# Patient Record
Sex: Female | Born: 1955
Health system: Southern US, Community
[De-identification: ages and names within clinical notes are randomized; demographics above are authoritative.]

## PROBLEM LIST (undated history)

## (undated) DIAGNOSIS — F419 Anxiety disorder, unspecified: Secondary | ICD-10-CM

## (undated) HISTORY — PX: SHOULDER ARTHROSCOPY W/ ROTATOR CUFF REPAIR: SHX2400

## (undated) HISTORY — DX: Anxiety disorder, unspecified: F41.9

## (undated) HISTORY — PX: CHOLECYSTECTOMY: SHX55

## (undated) HISTORY — PX: ABDOMINAL HYSTERECTOMY: SHX81

---

## 1998-09-03 ENCOUNTER — Encounter: Payer: Self-pay | Admitting: Emergency Medicine

## 1998-09-03 ENCOUNTER — Emergency Department (HOSPITAL_COMMUNITY): Admission: EM | Admit: 1998-09-03 | Discharge: 1998-09-03 | Payer: Self-pay | Admitting: Emergency Medicine

## 1998-09-13 ENCOUNTER — Emergency Department (HOSPITAL_COMMUNITY): Admission: EM | Admit: 1998-09-13 | Discharge: 1998-09-13 | Payer: Self-pay | Admitting: Emergency Medicine

## 1998-09-29 ENCOUNTER — Emergency Department (HOSPITAL_COMMUNITY): Admission: EM | Admit: 1998-09-29 | Discharge: 1998-09-29 | Payer: Self-pay | Admitting: Emergency Medicine

## 2009-07-09 ENCOUNTER — Emergency Department (HOSPITAL_BASED_OUTPATIENT_CLINIC_OR_DEPARTMENT_OTHER): Admission: EM | Admit: 2009-07-09 | Discharge: 2009-07-09 | Payer: Self-pay | Admitting: Emergency Medicine

## 2009-09-03 ENCOUNTER — Ambulatory Visit: Payer: Self-pay | Admitting: Diagnostic Radiology

## 2009-09-03 ENCOUNTER — Emergency Department (HOSPITAL_BASED_OUTPATIENT_CLINIC_OR_DEPARTMENT_OTHER): Admission: EM | Admit: 2009-09-03 | Discharge: 2009-09-04 | Payer: Self-pay | Admitting: Emergency Medicine

## 2010-05-24 LAB — DIFFERENTIAL
Basophils Absolute: 0.1 10*3/uL (ref 0.0–0.1)
Basophils Relative: 1 % (ref 0–1)
Eosinophils Absolute: 0.1 10*3/uL (ref 0.0–0.7)
Eosinophils Relative: 2 % (ref 0–5)
Lymphocytes Relative: 35 % (ref 12–46)
Lymphs Abs: 2.6 10*3/uL (ref 0.7–4.0)
Monocytes Absolute: 0.5 10*3/uL (ref 0.1–1.0)
Monocytes Relative: 7 % (ref 3–12)
Neutro Abs: 4.2 10*3/uL (ref 1.7–7.7)
Neutrophils Relative %: 55 % (ref 43–77)

## 2010-05-24 LAB — CBC
HCT: 42.8 % (ref 36.0–46.0)
Hemoglobin: 14.5 g/dL (ref 12.0–15.0)
MCH: 30.5 pg (ref 26.0–34.0)
MCHC: 34 g/dL (ref 30.0–36.0)
MCV: 89.8 fL (ref 78.0–100.0)
Platelets: 249 10*3/uL (ref 150–400)
RBC: 4.77 MIL/uL (ref 3.87–5.11)
RDW: 12.9 % (ref 11.5–15.5)
WBC: 7.5 10*3/uL (ref 4.0–10.5)

## 2010-05-24 LAB — BASIC METABOLIC PANEL
BUN: 12 mg/dL (ref 6–23)
CO2: 30 mEq/L (ref 19–32)
Calcium: 9.7 mg/dL (ref 8.4–10.5)
Chloride: 107 mEq/L (ref 96–112)
Creatinine, Ser: 1 mg/dL (ref 0.4–1.2)
GFR calc Af Amer: >60 mL/min (ref >60)
GFR calc non Af Amer: 58 mL/min — ABNORMAL LOW (ref >60)
Glucose, Bld: 123 mg/dL — ABNORMAL HIGH (ref 70–99)
Potassium: 4.5 mEq/L (ref 3.5–5.1)
Sodium: 145 mEq/L (ref 135–145)

## 2010-05-24 LAB — POCT CARDIAC MARKERS
CKMB, poc: <1 ng/mL — ABNORMAL LOW (ref 1.0–8.0)
Myoglobin, poc: 44.4 ng/mL (ref 12–200)
Troponin i, poc: <0.05 ng/mL (ref 0.00–0.09)

## 2010-07-07 ENCOUNTER — Emergency Department (HOSPITAL_BASED_OUTPATIENT_CLINIC_OR_DEPARTMENT_OTHER)
Admission: EM | Admit: 2010-07-07 | Discharge: 2010-07-07 | Disposition: A | Discharge status: Home / Self Care | Payer: Self-pay | Attending: Emergency Medicine | Admitting: Emergency Medicine

## 2010-07-07 DIAGNOSIS — E119 Type 2 diabetes mellitus without complications: Secondary | ICD-10-CM | POA: Insufficient documentation

## 2010-07-07 DIAGNOSIS — M79609 Pain in unspecified limb: Secondary | ICD-10-CM | POA: Insufficient documentation

## 2010-09-14 ENCOUNTER — Emergency Department (HOSPITAL_BASED_OUTPATIENT_CLINIC_OR_DEPARTMENT_OTHER)
Admission: EM | Admit: 2010-09-14 | Discharge: 2010-09-14 | Disposition: A | Discharge status: Home / Self Care | Payer: Self-pay | Attending: Emergency Medicine | Admitting: Emergency Medicine

## 2010-09-14 ENCOUNTER — Emergency Department (INDEPENDENT_AMBULATORY_CARE_PROVIDER_SITE_OTHER): Payer: Self-pay

## 2010-09-14 ENCOUNTER — Encounter: Payer: Self-pay | Admitting: *Deleted

## 2010-09-14 DIAGNOSIS — M79609 Pain in unspecified limb: Secondary | ICD-10-CM | POA: Insufficient documentation

## 2010-09-14 DIAGNOSIS — R209 Unspecified disturbances of skin sensation: Secondary | ICD-10-CM | POA: Insufficient documentation

## 2010-09-14 DIAGNOSIS — M5412 Radiculopathy, cervical region: Secondary | ICD-10-CM | POA: Insufficient documentation

## 2010-09-14 DIAGNOSIS — M542 Cervicalgia: Secondary | ICD-10-CM

## 2010-09-14 DIAGNOSIS — E119 Type 2 diabetes mellitus without complications: Secondary | ICD-10-CM | POA: Insufficient documentation

## 2010-09-14 MED ORDER — HYDROCODONE-ACETAMINOPHEN 5-325 MG PO TABS: 2.0000 | ORAL_TABLET | ORAL | Status: AC | PRN

## 2010-09-14 MED ORDER — PREDNISONE 10 MG PO TABS: 20.0000 mg | ORAL_TABLET | Freq: Two times a day (BID) | ORAL | Status: AC

## 2010-09-14 MED ORDER — PREDNISONE 10 MG PO TABS: 20.0000 mg | ORAL_TABLET | Freq: Two times a day (BID) | ORAL | Status: DC
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
## 2010-09-14 NOTE — ED Notes (Signed)
Pt state that she was seen two months ago for same sx told she had a muscle strain from playing the wii pt reports that she has had neck shoulder and arm pain as well as numbness in 4th and 5th digits of right hand

## 2010-10-11 NOTE — ED Provider Notes (Signed)
History     CSN: [id number] Arrival date & time: 09/14/2010  8:49 PM  Chief Complaint  Patient presents with  . Arm Injury   HPI  Past Medical History  Diagnosis Date  . Diabetes mellitus     Past Surgical History  Procedure Date  . Cholecystectomy   . Abdominal hysterectomy     History reviewed. No pertinent family history.  History  Substance Use Topics  . Smoking status: Never Smoker   . Smokeless tobacco: Not on file  . Alcohol Use: No    OB History    Grav Para Term Preterm Abortions TAB SAB Ect Mult Living                  Review of Systems  Physical Exam  BP 131/88  Pulse 97  Temp(Src) 98.1 F (36.7 C) (Oral)  Resp 20  Ht 5' (1.524 m)  SpO2 96%  Physical Exam  ED Course  Procedures  MDM Agree with above, signed under my name. Tayonna Bacha-Rasch MD     Michai Dieppa Smitty Cords, MD 10/11/10 [phone]

## 2012-08-22 ENCOUNTER — Emergency Department (HOSPITAL_BASED_OUTPATIENT_CLINIC_OR_DEPARTMENT_OTHER): Payer: Self-pay

## 2012-08-22 ENCOUNTER — Encounter (HOSPITAL_BASED_OUTPATIENT_CLINIC_OR_DEPARTMENT_OTHER): Payer: Self-pay

## 2012-08-22 ENCOUNTER — Emergency Department (HOSPITAL_BASED_OUTPATIENT_CLINIC_OR_DEPARTMENT_OTHER)
Admission: EM | Admit: 2012-08-22 | Discharge: 2012-08-22 | Disposition: A | Discharge status: Home / Self Care | Payer: Self-pay | Attending: Emergency Medicine | Admitting: Emergency Medicine

## 2012-08-22 DIAGNOSIS — M779 Enthesopathy, unspecified: Secondary | ICD-10-CM

## 2012-08-22 DIAGNOSIS — E119 Type 2 diabetes mellitus without complications: Secondary | ICD-10-CM | POA: Insufficient documentation

## 2012-08-22 DIAGNOSIS — Z79899 Other long term (current) drug therapy: Secondary | ICD-10-CM | POA: Insufficient documentation

## 2012-08-22 DIAGNOSIS — M65839 Other synovitis and tenosynovitis, unspecified forearm: Secondary | ICD-10-CM | POA: Insufficient documentation

## 2012-08-22 NOTE — ED Provider Notes (Signed)
I saw and evaluated the patient, reviewed the resident's note and I agree with the findings and plan.  No signs of swelling or infection.? Overuse injury.  Will splint.  Dc home.  OTC meds.  Follow up with sports med as needed  Celene Kras, MD 08/22/12 2019

## 2012-08-22 NOTE — ED Provider Notes (Signed)
History     CSN: [id number]  Arrival date & time 08/22/12  1901   None     Chief Complaint  Patient presents with  . Joint Swelling    (Consider location/radiation/quality/duration/timing/severity/associated sxs/prior treatment) HPI Pt had sudden onset swelling of right thumb worsening throughout the day. Pt doesn't remember any trauma to the area or other injury to the hand/thumb. She has been doing many repetitive movement activities including crocheting and video gaming and using a wand to play a game with that hand and thumb. She is not doing any other heavy lifting and hasn't been gardening. Pt doesn't have any known allergies or taken any OTC meds to help relief symptoms. Pt is not really having pain but just some limited ROM. This problem has not happened before but pt has had radicular pain in that arm before and been evaluated by ED where no acute cause was found and pt was lost to f/u with orthopedics.   Past Medical History  Diagnosis Date  . Diabetes mellitus     Past Surgical History  Procedure Laterality Date  . Cholecystectomy    . Abdominal hysterectomy      No family history on file.  History  Substance Use Topics  . Smoking status: Never Smoker   . Smokeless tobacco: Not on file  . Alcohol Use: No    OB History   Grav Para Term Preterm Abortions TAB SAB Ect Mult Living                  Review of Systems  Constitutional: Negative for fever.  Musculoskeletal: Positive for joint swelling. Negative for myalgias and arthralgias.  Skin: Negative for color change, pallor, rash and wound.  Neurological: Negative for tremors and weakness.    Allergies  Review of patient's allergies indicates no known allergies.  Home Medications   Current Outpatient Rx  Name  Route  Sig  Dispense  Refill  . LISINOPRIL PO   Oral   Take by mouth.         Marland Kitchen LOVASTATIN PO   Oral   Take by mouth.         . VENLAFAXINE HCL PO   Oral   Take by mouth.         . diphenhydrAMINE (BENADRYL) 2 % cream   Topical   Apply 1 application topically 4 (four) times daily as needed. For sores on arm          . glipiZIDE (GLUCOTROL) 10 MG tablet   Oral   Take 10 mg by mouth 2 (two) times daily before a meal.           . ibuprofen (ADVIL,MOTRIN) 200 MG tablet   Oral   Take 600 mg by mouth as needed. For pain          . metFORMIN (GLUMETZA) 500 MG (MOD) 24 hr tablet   Oral   Take 500 mg by mouth 2 (two) times daily with a meal.             BP 128/84  Pulse 77  Temp(Src) 98.1 F (36.7 C) (Oral)  Resp 20  Ht 5' (1.524 m)  Wt 200 lb (90.719 kg)  BMI 39.06 kg/m2  SpO2 98%  Physical Exam General: resting in bed, NAD HEENT: PERRL, EOMI, MMM, no scleral icterus Cardiac: RRR, no rubs, murmurs or gallops Pulm: clear to auscultation bilaterally, moving normal volumes of air Abd: soft, nontender, nondistended, BS present Ext: warm and  well perfused, no pedal edema, right thumb slightly swollen, ttp only over , some limitation in thumb opposition 2/2 swelling not pain, thumb flexion/extension/abduction and adduction all normal, FROM of wrist and other finger digits, no effusions noted, no other joints inflammed or sore, no skin rashes Neuro: alert and oriented X3, cranial nerves II-XII grossly intact   ED Course  Procedures (including critical care time)  Labs Reviewed - No data to display No results found.   No diagnosis found.    MDM  730 evaluated pt and no open lesions, concern for cellulitis, gout, or monacular joint infection but will order XRay to r/o acute fracture.   Pt most likely tendonitis and prn NSAIDs, RICE was advised for patient. Pt was also placed in thumb spica.   Pt was discussed with Dr. Lynelle Doctor.         Christen Bame, MD 08/22/12 2017

## 2012-08-22 NOTE — ED Notes (Signed)
C/o swelling to right thumb x 4 days-denies injury

## 2013-12-30 ENCOUNTER — Encounter (HOSPITAL_BASED_OUTPATIENT_CLINIC_OR_DEPARTMENT_OTHER): Payer: Self-pay | Admitting: Emergency Medicine

## 2013-12-30 ENCOUNTER — Emergency Department (HOSPITAL_BASED_OUTPATIENT_CLINIC_OR_DEPARTMENT_OTHER): Payer: Self-pay

## 2013-12-30 ENCOUNTER — Emergency Department (HOSPITAL_BASED_OUTPATIENT_CLINIC_OR_DEPARTMENT_OTHER)
Admission: EM | Admit: 2013-12-30 | Discharge: 2013-12-30 | Disposition: A | Discharge status: Home / Self Care | Payer: Self-pay | Attending: Emergency Medicine | Admitting: Emergency Medicine

## 2013-12-30 DIAGNOSIS — M722 Plantar fascial fibromatosis: Secondary | ICD-10-CM | POA: Insufficient documentation

## 2013-12-30 DIAGNOSIS — M79661 Pain in right lower leg: Secondary | ICD-10-CM | POA: Insufficient documentation

## 2013-12-30 DIAGNOSIS — Z79899 Other long term (current) drug therapy: Secondary | ICD-10-CM | POA: Insufficient documentation

## 2013-12-30 DIAGNOSIS — E119 Type 2 diabetes mellitus without complications: Secondary | ICD-10-CM | POA: Insufficient documentation

## 2013-12-30 MED ORDER — HYDROCODONE-ACETAMINOPHEN 5-325 MG PO TABS: 1.0000 | ORAL_TABLET | ORAL | Status: DC | PRN

## 2013-12-30 MED ORDER — NAPROXEN 500 MG PO TABS
500.0000 mg | ORAL_TABLET | Freq: Two times a day (BID) | ORAL | Status: DC
Start: 2013-12-30 — End: 2018-01-09

## 2013-12-30 NOTE — ED Provider Notes (Signed)
CSN: [id number]     Arrival date & time 12/30/13  1859 History   First MD Initiated Contact with Patient 12/30/13 2041     Chief Complaint  Patient presents with  . Leg Pain     (Consider location/radiation/quality/duration/timing/severity/associated sxs/prior Treatment) HPI Comments: The patient is a 58 year old female presenting to the emergency department with chief complaint of right ankle, calf pain. Patient reports intermittent heel pain for several months worsening over the last several days. She reports associated right calf pain since today and right hip pain. Denies change in gait. She denies right lower extremity edema. She reports recently traveling from the beach, onset of calf discomfort after. No family history or personal history of DVT/PE, lower extremity swelling, smoking, cancer, or exogenous estrogen.  Denies injury to foot.  The history is provided by the patient. No language interpreter was used.    Past Medical History  Diagnosis Date  . Diabetes mellitus    Past Surgical History  Procedure Laterality Date  . Cholecystectomy    . Abdominal hysterectomy     No family history on file. History  Substance Use Topics  . Smoking status: Never Smoker   . Smokeless tobacco: Not on file  . Alcohol Use: No   OB History   Grav Para Term Preterm Abortions TAB SAB Ect Mult Living                 Review of Systems  Respiratory: Negative for cough and shortness of breath.   Cardiovascular: Negative for leg swelling.  Musculoskeletal: Positive for arthralgias, gait problem and myalgias.      Allergies  Review of patient's allergies indicates no known allergies.  Home Medications   Prior to Admission medications   Medication Sig Start Date End Date Taking? Authorizing Provider  diphenhydrAMINE (BENADRYL) 2 % cream Apply 1 application topically 4 (four) times daily as needed. For sores on arm     Historical Provider, MD  glipiZIDE (GLUCOTROL) 10 MG tablet  Take 10 mg by mouth 2 (two) times daily before a meal.      Historical Provider, MD  ibuprofen (ADVIL,MOTRIN) 200 MG tablet Take 600 mg by mouth as needed. For pain     Historical Provider, MD  LISINOPRIL PO Take by mouth.    Historical Provider, MD  LOVASTATIN PO Take by mouth.    Historical Provider, MD  metFORMIN (GLUMETZA) 500 MG (MOD) 24 hr tablet Take 500 mg by mouth 2 (two) times daily with a meal.      Historical Provider, MD  VENLAFAXINE HCL PO Take by mouth.    Historical Provider, MD   BP 105/71  Pulse 80  Temp(Src) 97.9 F (36.6 C) (Oral)  Resp 18  Ht 5\' 2"  (1.575 m)  Wt 188 lb (85.276 kg)  BMI 34.38 kg/m2  SpO2 96% Physical Exam  Nursing note and vitals reviewed. Constitutional: She is oriented to person, place, and time. She appears well-developed and well-nourished. No distress.  HENT:  Head: Normocephalic and atraumatic.  Neck: Neck supple.  Cardiovascular:  Pulses:      Dorsalis pedis pulses are 2+ on the right side, and 2+ on the left side.  Negative Homans.No lower extremity edema. No calf tenderness bilaterally.  Pulmonary/Chest: Effort normal. No respiratory distress.  Musculoskeletal:       Right hip: She exhibits normal range of motion.       Right knee: She exhibits normal range of motion. No tenderness found.  Right foot: She exhibits normal range of motion, no bony tenderness, no swelling and no deformity.  Neurological: She is alert and oriented to person, place, and time.  Skin: Skin is warm and dry. She is not diaphoretic.  Psychiatric: She has a normal mood and affect. Her behavior is normal.    ED Course  Procedures (including critical care time) Labs Review Labs Reviewed - No data to display  Imaging Review [place] Venous Img Lower Unilateral Right  12/30/2013   CLINICAL DATA:  Right posterior heel pain, radiating to the calf and thigh, swelling to the right ankle.  EXAM: Right LOWER EXTREMITY VENOUS DOPPLER ULTRASOUND  TECHNIQUE: Gray-scale  sonography with graded compression, as well as color Doppler and duplex ultrasound were performed to evaluate the lower extremity deep venous systems from the level of the common femoral vein and including the common femoral, femoral, profunda femoral, popliteal and calf veins including the posterior tibial, peroneal and gastrocnemius veins when visible. The superficial great saphenous vein was also interrogated. Spectral Doppler was utilized to evaluate flow at rest and with distal augmentation maneuvers in the common femoral, femoral and popliteal veins.  COMPARISON:  None.  FINDINGS: Common Femoral Vein: No evidence of thrombus. Normal compressibility, respiratory phasicity and response to augmentation.  Saphenofemoral Junction: No evidence of thrombus. Normal compressibility and flow on color Doppler imaging.  Profunda Femoral Vein: No evidence of thrombus. Normal compressibility and flow on color Doppler imaging.  Femoral Vein: Duplicated. No evidence of thrombus. Normal compressibility, respiratory phasicity and response to augmentation.  Popliteal Vein: No evidence of thrombus. Normal compressibility, respiratory phasicity and response to augmentation.  Calf Veins: No evidence of thrombus. Normal compressibility and flow on color Doppler imaging.  Superficial Great Saphenous Vein: No evidence of thrombus. Normal compressibility and flow on color Doppler imaging.  Venous Reflux:  None.  Other Findings:  None.  IMPRESSION: No evidence of deep venous thrombosis within the visualized veins of the right lower extremity.   Electronically Signed   By: Jearld LeschAndrew  DelGaizo M.D.   On: 12/30/2013 21:24     EKG Interpretation None      MDM   Final diagnoses:  Calf pain, right  Plantar fascia syndrome   Patient presents with right ankle pain, no injury, no obvious abnormalities. Patient also complains of right calf pain, onset after car ride no obvious sign of DVT plan to ultrasound to rule out DVT. Ultrasound  without DVT. Plan to treat with anti-inflammatories, encouraged supporting shoes, ice, follow-up with orthopedic as needed. Meds given in ED:  Medications - No data to display  Discharge Medication List as of 12/30/2013 10:01 PM    START taking these medications   Details  HYDROcodone-acetaminophen (NORCO/VICODIN) 5-325 MG per tablet Take 1 tablet by mouth every 4 (four) hours as needed., Starting 12/30/2013, Until Discontinued, Print    naproxen (NAPROSYN) 500 MG tablet Take 1 tablet (500 mg total) by mouth 2 (two) times daily., Starting 12/30/2013, Until Discontinued, Print        Mellody DrownLauren Leigh Kaeding, PA-C 12/30/13 [phone]

## 2013-12-30 NOTE — ED Provider Notes (Signed)
Medical screening examination/treatment/procedure(s) were performed by non-physician practitioner and as supervising physician I was immediately available for consultation/collaboration.     Geoffery Lyonsouglas Justise Ehmann, MD 12/30/13 [phone]

## 2013-12-30 NOTE — ED Notes (Signed)
Pt reports right ankle pain and heel pain that started this weekend but now has calf and thigh pain that radiates to buttock.  Denies injury.  Reports rode to the beach this weekend.  Mild swelling to right ankle.

## 2013-12-30 NOTE — Discharge Instructions (Signed)
Call for a follow up appointment with a Family or Primary Care Provider.  Return if Symptoms worsen.   Take medication as prescribed.  Ice your heel 3-4 times a day. Wear supportive shoes.

## 2016-06-30 ENCOUNTER — Emergency Department (HOSPITAL_COMMUNITY)
Admission: EM | Admit: 2016-06-30 | Discharge: 2016-07-01 | Disposition: A | Discharge status: Home / Self Care | Payer: BLUE CROSS/BLUE SHIELD | Attending: Emergency Medicine | Admitting: Emergency Medicine

## 2016-06-30 ENCOUNTER — Encounter (HOSPITAL_COMMUNITY): Payer: Self-pay | Admitting: Vascular Surgery

## 2016-06-30 DIAGNOSIS — F17210 Nicotine dependence, cigarettes, uncomplicated: Secondary | ICD-10-CM | POA: Insufficient documentation

## 2016-06-30 DIAGNOSIS — Z7984 Long term (current) use of oral hypoglycemic drugs: Secondary | ICD-10-CM | POA: Diagnosis not present

## 2016-06-30 DIAGNOSIS — E860 Dehydration: Secondary | ICD-10-CM | POA: Insufficient documentation

## 2016-06-30 DIAGNOSIS — E1165 Type 2 diabetes mellitus with hyperglycemia: Secondary | ICD-10-CM | POA: Diagnosis not present

## 2016-06-30 DIAGNOSIS — R739 Hyperglycemia, unspecified: Secondary | ICD-10-CM

## 2016-06-30 LAB — I-STAT VENOUS BLOOD GAS, ED
ACID-BASE DEFICIT: 4 mmol/L — AB (ref 0.0–2.0)
Bicarbonate: 21.7 mmol/L (ref 20.0–28.0)
O2 SAT: 27 %
PH VEN: 7.329 (ref 7.250–7.430)
Patient temperature: 98.6
TCO2: 23 mmol/L (ref 0–100)
pCO2, Ven: 41.2 mmHg — ABNORMAL LOW (ref 44.0–60.0)
pO2, Ven: 19 mmHg — CL (ref 32.0–45.0)

## 2016-06-30 LAB — CBC WITH DIFFERENTIAL/PLATELET
BASOS ABS: 0 10*3/uL (ref 0.0–0.1)
Basophils Relative: 0 %
EOS ABS: 0 10*3/uL (ref 0.0–0.7)
EOS PCT: 0 %
HCT: 43 % (ref 36.0–46.0)
Hemoglobin: 15.6 g/dL — ABNORMAL HIGH (ref 12.0–15.0)
LYMPHS PCT: 9 %
Lymphs Abs: 0.6 10*3/uL — ABNORMAL LOW (ref 0.7–4.0)
MCH: 30.6 pg (ref 26.0–34.0)
MCHC: 36.3 g/dL — ABNORMAL HIGH (ref 30.0–36.0)
MCV: 84.5 fL (ref 78.0–100.0)
MONO ABS: 0.4 10*3/uL (ref 0.1–1.0)
Monocytes Relative: 5 %
Neutro Abs: 6.2 10*3/uL (ref 1.7–7.7)
Neutrophils Relative %: 86 %
PLATELETS: 212 10*3/uL (ref 150–400)
RBC: 5.09 MIL/uL (ref 3.87–5.11)
RDW: 12.1 % (ref 11.5–15.5)
WBC: 7.2 10*3/uL (ref 4.0–10.5)

## 2016-06-30 LAB — URINALYSIS, ROUTINE W REFLEX MICROSCOPIC
BACTERIA UA: NONE SEEN
BILIRUBIN URINE: NEGATIVE
Glucose, UA: >=500 mg/dL — AB
Ketones, ur: NEGATIVE mg/dL
LEUKOCYTES UA: NEGATIVE
NITRITE: NEGATIVE
Protein, ur: NEGATIVE mg/dL
SPECIFIC GRAVITY, URINE: 1.03 (ref 1.005–1.030)
WBC UA: NONE SEEN WBC/hpf (ref 0–5)
pH: 5 (ref 5.0–8.0)

## 2016-06-30 LAB — CBG MONITORING, ED: Glucose-Capillary: 427 mg/dL — ABNORMAL HIGH (ref 65–99)

## 2016-06-30 LAB — BASIC METABOLIC PANEL
Anion gap: 13 (ref 5–15)
BUN: 16 mg/dL (ref 6–20)
CHLORIDE: 94 mmol/L — AB (ref 101–111)
CO2: 19 mmol/L — AB (ref 22–32)
CREATININE: 1.2 mg/dL — AB (ref 0.44–1.00)
Calcium: 9.4 mg/dL (ref 8.9–10.3)
GFR calc non Af Amer: 49 mL/min — ABNORMAL LOW (ref >60)
GFR, EST AFRICAN AMERICAN: 57 mL/min — AB (ref >60)
Glucose, Bld: 726 mg/dL (ref 65–99)
Potassium: 4.2 mmol/L (ref 3.5–5.1)
SODIUM: 126 mmol/L — AB (ref 135–145)

## 2016-06-30 MED ORDER — SODIUM CHLORIDE 0.9 % IV BOLUS (SEPSIS)
1000.0000 mL | Freq: Once | INTRAVENOUS | Status: AC
  Administered 2016-06-30: 1000 mL via INTRAVENOUS

## 2016-06-30 MED ORDER — METFORMIN HCL 1000 MG PO TABS: 1000.0000 mg | ORAL_TABLET | Freq: Two times a day (BID) | ORAL | 0 refills | Status: DC

## 2016-06-30 MED ORDER — INSULIN ASPART 100 UNIT/ML ~~LOC~~ SOLN
10.0000 [IU] | Freq: Once | SUBCUTANEOUS | Status: AC
  Administered 2016-06-30: 10 [IU] via INTRAVENOUS
  Filled 2016-06-30: qty 1

## 2016-06-30 MED ORDER — GLIPIZIDE 10 MG PO TABS
10.0000 mg | ORAL_TABLET | Freq: Two times a day (BID) | ORAL | 0 refills | Status: DC
Start: 2016-06-30 — End: 2018-01-09

## 2016-06-30 MED ORDER — SODIUM CHLORIDE 0.9 % IV SOLN
INTRAVENOUS | Status: DC
  Administered 2016-06-30: 3.7 [IU]/h via INTRAVENOUS
  Filled 2016-06-30: qty 2.5

## 2016-06-30 MED ORDER — LACTATED RINGERS IV BOLUS (SEPSIS)
1000.0000 mL | Freq: Once | INTRAVENOUS | Status: AC
  Administered 2016-06-30: 1000 mL via INTRAVENOUS

## 2016-06-30 MED ORDER — DEXTROSE-NACL 5-0.45 % IV SOLN: INTRAVENOUS | Status: DC

## 2016-06-30 NOTE — ED Triage Notes (Signed)
Pt reports to the ED for eval of hyperglycemia. She is on Metformin and Trulicity and she states that she has not been on her Metformin over the past week because it makes her nauseated. She also states that she has been drinking regular Pepsis. She states that tonight she noticed her BG was too high to read. She states that she has noticed she has felt hot and cold, having dry mouth, and polyuria.

## 2016-06-30 NOTE — ED Provider Notes (Signed)
[facility]-EMERGENCY DEPT Provider Note   CSN: [id number] Arrival date & time: 06/30/16  2110     History   Chief Complaint Chief Complaint  Patient presents with  . Hyperglycemia    HPI Tricia Ramsey is a 61 y.o. female.  The history is provided by the patient.  Hyperglycemia  Blood sugar level PTA:  Hi Severity:  Moderate Onset quality:  Gradual Duration:  4 months (was last glucose check before today) Timing:  Constant Progression:  Worsening Chronicity:  Chronic Diabetes status:  Controlled with oral medications Current diabetic therapy:  Noncompliant with all Time since last antidiabetic medication:  4 months Context: noncompliance   Relieved by:  Nothing Ineffective treatments:  None tried Associated symptoms: dehydration, increased appetite, increased thirst, malaise, polyuria and weakness   Associated symptoms: no fever     Past Medical History:  Diagnosis Date  . Diabetes mellitus     There are no active problems to display for this patient.   Past Surgical History:  Procedure Laterality Date  . ABDOMINAL HYSTERECTOMY    . CHOLECYSTECTOMY    . SHOULDER ARTHROSCOPY W/ ROTATOR CUFF REPAIR      OB History    No data available       Home Medications    Prior to Admission medications   Medication Sig Start Date End Date Taking? Authorizing Provider  diphenhydrAMINE (BENADRYL) 2 % cream Apply 1 application topically 4 (four) times daily as needed. For sores on arm     Historical Provider, MD  glipiZIDE (GLUCOTROL) 10 MG tablet Take 10 mg by mouth 2 (two) times daily before a meal.      Historical Provider, MD  HYDROcodone-acetaminophen (NORCO/VICODIN) 5-325 MG per tablet Take 1 tablet by mouth every 4 (four) hours as needed. 12/30/13   Mellody Drown, PA-C  LISINOPRIL PO Take by mouth.    Historical Provider, MD  LOVASTATIN PO Take by mouth.    Historical Provider, MD  metFORMIN (GLUMETZA) 500 MG (MOD) 24 hr tablet Take 500 mg by mouth 2 (two) times  daily with a meal.      Historical Provider, MD  naproxen (NAPROSYN) 500 MG tablet Take 1 tablet (500 mg total) by mouth 2 (two) times daily. 12/30/13   Mellody Drown, PA-C  VENLAFAXINE HCL PO Take by mouth.    Historical Provider, MD    Family History No family history on file.  Social History Social History  Substance Use Topics  . Smoking status: Current Every Day Smoker    Packs/day: 1.00    Types: Cigarettes  . Smokeless tobacco: Never Used  . Alcohol use No     Allergies   Patient has no known allergies.   Review of Systems Review of Systems  Constitutional: Negative for fever.  Endocrine: Positive for polydipsia and polyuria.  Neurological: Positive for weakness.  All other systems reviewed and are negative.    Physical Exam Updated Vital Signs BP 124/86 (BP Location: Right Arm)   Pulse (!) 137   Temp 98.6 F (37 C) (Oral)   Resp 18   SpO2 97%   Physical Exam  Constitutional: She is oriented to person, place, and time. She appears well-developed and well-nourished. No distress.  HENT:  Head: Normocephalic.  Nose: Nose normal.  Eyes: Conjunctivae are normal.  Neck: Neck supple. No tracheal deviation present.  Cardiovascular: Regular rhythm and normal heart sounds.  Tachycardia present.   Pulmonary/Chest: Effort normal and breath sounds normal. No respiratory distress.  Abdominal: Soft.  She exhibits no distension.  Neurological: She is alert and oriented to person, place, and time.  Skin: Skin is warm and dry.  Psychiatric: She has a normal mood and affect.     ED Treatments / Results  Labs (all labs ordered are listed, but only abnormal results are displayed) Labs Reviewed  BASIC METABOLIC PANEL - Abnormal; Notable for the following:       Result Value   Sodium 126 (*)    Chloride 94 (*)    CO2 19 (*)    Glucose, Bld 726 (*)    Creatinine, Ser 1.20 (*)    GFR calc non Af Amer 49 (*)    GFR calc Af Amer 57 (*)    All other components within  normal limits  URINALYSIS, ROUTINE W REFLEX MICROSCOPIC - Abnormal; Notable for the following:    Color, Urine STRAW (*)    Glucose, UA >=500 (*)    Hgb urine dipstick SMALL (*)    Squamous Epithelial / LPF 0-5 (*)    All other components within normal limits  CBC WITH DIFFERENTIAL/PLATELET - Abnormal; Notable for the following:    Hemoglobin 15.6 (*)    MCHC 36.3 (*)    Lymphs Abs 0.6 (*)    All other components within normal limits  CBG MONITORING, ED - Abnormal; Notable for the following:    Glucose-Capillary >600 (*)    All other components within normal limits  I-STAT VENOUS BLOOD GAS, ED - Abnormal; Notable for the following:    pCO2, Ven 41.2 (*)    pO2, Ven 19.0 (*)    Acid-base deficit 4.0 (*)    All other components within normal limits  CBG MONITORING, ED - Abnormal; Notable for the following:    Glucose-Capillary >600 (*)    All other components within normal limits  CBG MONITORING, ED - Abnormal; Notable for the following:    Glucose-Capillary 427 (*)    All other components within normal limits  CBG MONITORING, ED - Abnormal; Notable for the following:    Glucose-Capillary 340 (*)    All other components within normal limits  CBG MONITORING, ED - Abnormal; Notable for the following:    Glucose-Capillary 266 (*)    All other components within normal limits  CBG MONITORING, ED - Abnormal; Notable for the following:    Glucose-Capillary 282 (*)    All other components within normal limits    EKG  EKG Interpretation  Date/Time:  Wednesday June 30 2016 21:30:28 EDT Ventricular Rate:  143 PR Interval:  126 QRS Duration: 64 QT Interval:  338 QTC Calculation: 521 R Axis:   52 Text Interpretation:  Sinus tachycardia Possible Left atrial enlargement Nonspecific ST and T wave abnormality probably rate related Abnormal ECG Confirmed by Johnaton Sonneborn MD, Richanda Darin ([id number]) on 06/30/2016 9:40:07 PM       Radiology No results found.  Procedures Procedures (including  critical care time)  Medications Ordered in ED Medications  insulin aspart (novoLOG) injection 10 Units (not administered)  sodium chloride 0.9 % bolus 1,000 mL (not administered)    And  sodium chloride 0.9 % bolus 1,000 mL (not administered)     Initial Impression / Assessment and Plan / ED Course  I have reviewed the triage vital signs and the nursing notes.  Pertinent labs & imaging results that were available during my care of the patient were reviewed by me and considered in my medical decision making (see chart for details).  61 year old female presents with hyperglycemia that is too high to read on her home glucose monitor after noncompliance with her home oral medication regimen. Blood sugar was greater than 700 on arrival, she was given 3 L of IV fluid and an insulin bolus and infusion which was able to bring down below 300 so that she can read at home inappropriately administer her medications. Refills were provided. Recommended follow-up with primary care physician or return to the emergency department with worsening or new concerning symptoms. No evidence of DKA or HNKK today without anion gap and I suspect mild acidosis is secondary to dehydration rather than metabolic derangement.  Final Clinical Impressions(s) / ED Diagnoses   Final diagnoses:  Hyperglycemia without ketosis  Dehydration    New Prescriptions Discharge Medication List as of 06/30/2016 11:40 PM    START taking these medications   Details  metFORMIN (GLUCOPHAGE) 1000 MG tablet Take 1 tablet (1,000 mg total) by mouth 2 (two) times daily., Starting Wed 06/30/2016, Print         Lyndal Pulley, MD 07/01/16 [phone]

## 2016-06-30 NOTE — ED Notes (Signed)
Dr Clydene Pugh given a copy of venous blood gas

## 2016-07-01 LAB — CBG MONITORING, ED
Glucose-Capillary: 266 mg/dL — ABNORMAL HIGH (ref 65–99)
Glucose-Capillary: 282 mg/dL — ABNORMAL HIGH (ref 65–99)
Glucose-Capillary: 340 mg/dL — ABNORMAL HIGH (ref 65–99)

## 2016-07-01 NOTE — ED Notes (Signed)
Per DR Patria Mane, D/C insulin drip and discharge patient

## 2016-07-01 NOTE — ED Notes (Signed)
Verified by Alona Bene, RN for insulin change

## 2017-06-15 ENCOUNTER — Emergency Department (HOSPITAL_COMMUNITY)
Admission: EM | Admit: 2017-06-15 | Discharge: 2017-06-15 | Disposition: A | Discharge status: Home / Self Care | Payer: BLUE CROSS/BLUE SHIELD | Attending: Emergency Medicine | Admitting: Emergency Medicine

## 2017-06-15 ENCOUNTER — Other Ambulatory Visit: Payer: Self-pay

## 2017-06-15 ENCOUNTER — Encounter (HOSPITAL_COMMUNITY): Payer: Self-pay | Admitting: Emergency Medicine

## 2017-06-15 DIAGNOSIS — E860 Dehydration: Secondary | ICD-10-CM

## 2017-06-15 DIAGNOSIS — Z9049 Acquired absence of other specified parts of digestive tract: Secondary | ICD-10-CM | POA: Insufficient documentation

## 2017-06-15 DIAGNOSIS — R739 Hyperglycemia, unspecified: Secondary | ICD-10-CM

## 2017-06-15 DIAGNOSIS — Z79899 Other long term (current) drug therapy: Secondary | ICD-10-CM | POA: Insufficient documentation

## 2017-06-15 DIAGNOSIS — E1165 Type 2 diabetes mellitus with hyperglycemia: Secondary | ICD-10-CM | POA: Insufficient documentation

## 2017-06-15 DIAGNOSIS — F17210 Nicotine dependence, cigarettes, uncomplicated: Secondary | ICD-10-CM | POA: Insufficient documentation

## 2017-06-15 DIAGNOSIS — Z7984 Long term (current) use of oral hypoglycemic drugs: Secondary | ICD-10-CM | POA: Insufficient documentation

## 2017-06-15 LAB — BASIC METABOLIC PANEL
Anion gap: 12 (ref 5–15)
BUN: 16 mg/dL (ref 6–20)
CALCIUM: 9.6 mg/dL (ref 8.9–10.3)
CO2: 19 mmol/L — ABNORMAL LOW (ref 22–32)
CREATININE: 1.22 mg/dL — AB (ref 0.44–1.00)
Chloride: 95 mmol/L — ABNORMAL LOW (ref 101–111)
GFR calc Af Amer: 55 mL/min — ABNORMAL LOW (ref >60)
GFR, EST NON AFRICAN AMERICAN: 48 mL/min — AB (ref >60)
GLUCOSE: 882 mg/dL — AB (ref 65–99)
Potassium: 4.1 mmol/L (ref 3.5–5.1)
Sodium: 126 mmol/L — ABNORMAL LOW (ref 135–145)

## 2017-06-15 LAB — URINALYSIS, ROUTINE W REFLEX MICROSCOPIC
BACTERIA UA: NONE SEEN
BILIRUBIN URINE: NEGATIVE
Glucose, UA: >=500 mg/dL — AB
HGB URINE DIPSTICK: NEGATIVE
Ketones, ur: 5 mg/dL — AB
Leukocytes, UA: NEGATIVE
NITRITE: NEGATIVE
PH: 6 (ref 5.0–8.0)
Protein, ur: NEGATIVE mg/dL
SPECIFIC GRAVITY, URINE: 1.029 (ref 1.005–1.030)

## 2017-06-15 LAB — CBC
HCT: 44.4 % (ref 36.0–46.0)
Hemoglobin: 15.3 g/dL — ABNORMAL HIGH (ref 12.0–15.0)
MCH: 31 pg (ref 26.0–34.0)
MCHC: 34.5 g/dL (ref 30.0–36.0)
MCV: 90.1 fL (ref 78.0–100.0)
Platelets: 189 10*3/uL (ref 150–400)
RBC: 4.93 MIL/uL (ref 3.87–5.11)
RDW: 13.1 % (ref 11.5–15.5)
WBC: 8.3 10*3/uL (ref 4.0–10.5)

## 2017-06-15 LAB — I-STAT VENOUS BLOOD GAS, ED
Acid-base deficit: 3 mmol/L — ABNORMAL HIGH (ref 0.0–2.0)
Bicarbonate: 22.9 mmol/L (ref 20.0–28.0)
O2 Saturation: 38 %
PCO2 VEN: 42.7 mmHg — AB (ref 44.0–60.0)
TCO2: 24 mmol/L (ref 22–32)
pH, Ven: 7.336 (ref 7.250–7.430)
pO2, Ven: 24 mmHg — CL (ref 32.0–45.0)

## 2017-06-15 LAB — CBG MONITORING, ED
GLUCOSE-CAPILLARY: 191 mg/dL — AB (ref 65–99)
Glucose-Capillary: >600 mg/dL (ref 65–99)
Glucose-Capillary: 262 mg/dL — ABNORMAL HIGH (ref 65–99)

## 2017-06-15 MED ORDER — SODIUM CHLORIDE 0.9 % IV BOLUS
3000.0000 mL | Freq: Once | INTRAVENOUS | Status: AC
  Administered 2017-06-15: 3000 mL via INTRAVENOUS

## 2017-06-15 MED ORDER — SODIUM CHLORIDE 0.9 % IV SOLN
INTRAVENOUS | Status: DC
  Administered 2017-06-15: 5.4 [IU]/h via INTRAVENOUS
  Filled 2017-06-15: qty 1

## 2017-06-15 MED ORDER — DEXTROSE-NACL 5-0.45 % IV SOLN: INTRAVENOUS | Status: DC

## 2017-06-15 NOTE — Discharge Instructions (Addendum)
Fill the prescription for your Trulicity and take as per usual dosing. Please follow up with your doctor for further management of diabetes and possible to change to another form of medication that is more easily available. Return to the emergency department with any new concerns.

## 2017-06-15 NOTE — Care Management (Addendum)
ED CM consulted concerning medication assistance. CM reviewed patient's record and patient states she recently lost her insurance, and cannot afford her insulin. Patient presented to [facility] ED with CBG over 800mg /dl.  Pt is eligible for MATCH. Discussed MATCH program and the guidelines including the  $3 co-pay per prescription. Pt verbalized understanding and is agreeable with accepting the assistance.  Pt enrolled and MATCH letter printed and given to patient with list of participating pharmacies.  Patient was instructed to present  prescriptions with MATCH letter to a participating Pharmacies from the list provided, Patient verbalizes  understanding and appreciation for the assistance. Reminded patient of the importance  of following up with PCP after ED visit   Pt verbalized understanding teach back done. Updated S. Upstill PA-C on transitional care plan. No further ED CM needs identified.

## 2017-06-15 NOTE — ED Notes (Signed)
Dr. Effie ShyWentz aware Glucose 882

## 2017-06-15 NOTE — ED Triage Notes (Addendum)
C/o "cotton mouth" and feeling tired today.. CBG meter at home read "HIGH".  States she hasn't had her diabetes medication in 2 weeks because she can't afford it.

## 2017-06-15 NOTE — ED Notes (Signed)
Social worker at bedside speaking with pt

## 2017-06-15 NOTE — ED Provider Notes (Signed)
[facility] EMERGENCY DEPARTMENT Provider Note   CSN: [id number] Arrival date & time: 06/15/17  1740     History   Chief Complaint Chief Complaint  Patient presents with  . Hyperglycemia    HPI Tricia Ramsey is a 62 y.o. female.  She presents for evaluation of dry mouth and elevated blood sugar.  She knows the symptoms today.  Last checked her blood sugar a couple weeks ago.  She ran out of her Trulicity 2 months ago had a one-week supply which she took about 2 weeks ago but ran out, 1 week ago.  She is having trouble affording this medication.  She denies nausea, vomiting, cough, shortness of breath, weakness or dizziness.  She has been able to work, doing a job "splitting wood."  There are no other known modifying factors.  HPI  Past Medical History:  Diagnosis Date  . Diabetes mellitus     There are no active problems to display for this patient.   Past Surgical History:  Procedure Laterality Date  . ABDOMINAL HYSTERECTOMY    . CHOLECYSTECTOMY    . SHOULDER ARTHROSCOPY W/ ROTATOR CUFF REPAIR       OB History   None      Home Medications    Prior to Admission medications   Medication Sig Start Date End Date Taking? Authorizing Provider  diphenhydrAMINE (BENADRYL) 2 % cream Apply 1 application topically 4 (four) times daily as needed. For sores on arm     [provider]  glipiZIDE (GLUCOTROL) 10 MG tablet Take 1 tablet (10 mg total) by mouth 2 (two) times daily before a meal. 06/30/16   Lyndal Pulley, MD  HYDROcodone-acetaminophen (NORCO/VICODIN) 5-325 MG per tablet Take 1 tablet by mouth every 4 (four) hours as needed. 12/30/13   Mellody Drown, PA-C  LISINOPRIL PO Take by mouth.    [provider]  LOVASTATIN PO Take by mouth.    [provider]  metFORMIN (GLUCOPHAGE) 1000 MG tablet Take 1 tablet (1,000 mg total) by mouth 2 (two) times daily. 06/30/16   Lyndal Pulley, MD  naproxen (NAPROSYN) 500 MG tablet Take 1  tablet (500 mg total) by mouth 2 (two) times daily. 12/30/13   Mellody Drown, PA-C  VENLAFAXINE HCL PO Take by mouth.    [provider]    Family History No family history on file.  Social History Social History   Tobacco Use  . Smoking status: Current Every Day Smoker    Packs/day: 1.00    Types: Cigarettes  . Smokeless tobacco: Never Used  Substance Use Topics  . Alcohol use: No  . Drug use: No     Allergies   Patient has no known allergies.   Review of Systems Review of Systems  All other systems reviewed and are negative.    Physical Exam Updated Vital Signs BP 110/86   Pulse 97   Temp 98.1 F (36.7 C) (Oral)   Resp (!) 22   Ht 5\' 1"  (1.549 m)   Wt 81.6 kg (180 lb)   SpO2 100%   BMI 34.01 kg/m   Physical Exam  Constitutional: She is oriented to person, place, and time. She appears well-developed and well-nourished. No distress.  Appears older than stated age  HENT:  Head: Normocephalic and atraumatic.  Eyes: Pupils are equal, round, and reactive to light. Conjunctivae and EOM are normal.  Neck: Normal range of motion and phonation normal. Neck supple.  Cardiovascular: Normal rate and regular  rhythm.  Pulmonary/Chest: Effort normal and breath sounds normal. No respiratory distress. She exhibits no tenderness.  Abdominal: Soft. She exhibits no distension. There is no tenderness. There is no guarding.  Musculoskeletal: Normal range of motion.  Neurological: She is alert and oriented to person, place, and time. She exhibits normal muscle tone.  Skin: Skin is warm and dry.  Psychiatric: She has a normal mood and affect. Her behavior is normal. Judgment and thought content normal.  Nursing note and vitals reviewed.    ED Treatments / Results  Labs (all labs ordered are listed, but only abnormal results are displayed) Labs Reviewed  BASIC METABOLIC PANEL - Abnormal; Notable for the following components:      Result Value   Sodium 126 (*)     Chloride 95 (*)    CO2 19 (*)    Glucose, Bld 882 (*)    Creatinine, Ser 1.22 (*)    GFR calc non Af Amer 48 (*)    GFR calc Af Amer 55 (*)    All other components within normal limits  CBC - Abnormal; Notable for the following components:   Hemoglobin 15.3 (*)    All other components within normal limits  URINALYSIS, ROUTINE W REFLEX MICROSCOPIC - Abnormal; Notable for the following components:   Color, Urine COLORLESS (*)    Glucose, UA >=500 (*)    Ketones, ur 5 (*)    Squamous Epithelial / LPF 0-5 (*)    All other components within normal limits  CBG MONITORING, ED - Abnormal; Notable for the following components:   Glucose-Capillary >600 (*)    All other components within normal limits  I-STAT VENOUS BLOOD GAS, ED - Abnormal; Notable for the following components:   pCO2, Ven 42.7 (*)    pO2, Ven 24.0 (*)    Acid-base deficit 3.0 (*)    All other components within normal limits  CBG MONITORING, ED - Abnormal; Notable for the following components:   Glucose-Capillary 262 (*)    All other components within normal limits  BLOOD GAS, VENOUS    EKG None  Radiology No results found.  Procedures .Critical Care Performed by: Mancel BaleWentz, Dewana Ammirati, MD Authorized by: Mancel BaleWentz, Mirelle Biskup, MD   Critical care provider statement:    Critical care time (minutes):  35   Critical care start time:  06/15/2017 8:00 PM   Critical care end time:  06/15/2017 8:45 PM   Critical care time was exclusive of:  Separately billable procedures and treating other patients   Critical care was time spent personally by me on the following activities:  Blood draw for specimens, development of treatment plan with patient or surrogate, discussions with consultants, evaluation of patient's response to treatment, examination of patient, obtaining history from patient or surrogate, ordering and performing treatments and interventions, ordering and review of laboratory studies, pulse oximetry, re-evaluation of patient's  condition, review of old charts and ordering and review of radiographic studies   (including critical care time)  Medications Ordered in ED Medications  dextrose 5 %-0.45 % sodium chloride infusion ( Intravenous Not Given 06/15/17 2044)  insulin regular (NOVOLIN R,HUMULIN R) 100 Units in sodium chloride 0.9 % 100 mL (1 Units/mL) infusion (5.4 Units/hr Intravenous New Bag/Given 06/15/17 0504)  sodium chloride 0.9 % bolus 3,000 mL (0 mLs Intravenous Stopped 06/15/17 2142)     Initial Impression / Assessment and Plan / ED Course  I have reviewed the triage vital signs and the nursing notes.  Pertinent labs & imaging  results that were available during my care of the patient were reviewed by me and considered in my medical decision making (see chart for details).  Clinical Course as of Jun 16 2147  Wed Jun 15, 2017  2036 Abnormal, sodium is low 126, chloride low 95, CO2 low 19, glucose high at 882, creatinine high 1.22.  Basic metabolic panel(!!) [EW]  2037 Normal except elevated glucose and ketones.  Urinalysis, Routine w reflex microscopic(!) [EW]  2037 High  CBG monitoring, ED(!!) [EW]  2038 Normal except hemoglobin 15.3, possibly related to dehydration versus tobacco abuse.  CBC(!) [EW]  2038 Normal  BP: 134/89 [EW]    Clinical Course User Index [EW] Mancel Bale, MD     Patient Vitals for the past 24 hrs:  BP Temp Temp src Pulse Resp SpO2 Height Weight  06/15/17 2100 110/86 - - 97 (!) 22 100 % - -  06/15/17 2045 (!) 100/58 - - 86 (!) 22 98 % - -  06/15/17 2030 116/81 - - 86 14 99 % - -  06/15/17 2015 124/79 - - 87 (!) 23 96 % - -  06/15/17 1841 - - - - - - 5\' 1"  (1.549 m) 81.6 kg (180 lb)  06/15/17 1840 134/89 98.1 F (36.7 C) Oral (!) 106 16 97 % - -    9:46 PM Reevaluation with update and discussion. After initial assessment and treatment, an updated evaluation reveals she is complaining of right leg cramping at this time.  Repeat vital signs are reassuring.Mancel Bale   Medical decision making-evaluation consistent with glycemia, and dehydration, secondary to not taking medications which have been prescribed.  Patient does not have monetary means to afford the medicine that she has been prescribed.  She requires admission for monitoring stabilization.  Nursing Notes Reviewed/ Care Coordinated Applicable Imaging Reviewed Interpretation of Laboratory Data incorporated into ED treatment   Care transitioned to oncoming provider team to evaluate after completion of treatment and consider appropriate disposition.    Final Clinical Impressions(s) / ED Diagnoses   Final diagnoses:  Hyperglycemia  Dehydration    ED Discharge Orders    None       Mancel Bale, MD 06/17/17 [id number]

## 2017-06-15 NOTE — ED Notes (Signed)
Leonia ReaderS. Upstill PA notified on pt.'s CBG .

## 2017-06-15 NOTE — ED Provider Notes (Signed)
Patient initially seen and care provided by Dr. Effie ShyWentz for hyperglycemia due to medication noncompliance with Trulicity secondary to financial limitations. She has no evidence of acidosis, and CBG is decreasing easily with IV fluids.  Discussed with Anola GurneyWanda Rogers, CM, who arranges medications for the patient to last until follow up with her PCP to discuss medication changes that she can comply with.   VSS. She can be discharged home. REturn precautions discussed.    Elpidio AnisUpstill, Kymani Laursen, PA-C 06/15/17 2325    Mancel BaleWentz, Elliott, MD 06/16/17 [phone]

## 2017-06-15 NOTE — Progress Notes (Signed)
CSW and CM met with pt. Pt stated she does not have insurance at this time. Pt does have a PCP, who she sees regularly. CSW discussed with pt applying for Medicaid, SNAP Benefits, and WIC for her daughter who is currently pregnant. CSW provided information and instructions on how to apply. CSW provided pt with [employer] information.   Wendelyn Breslow, [facility]  [phone]

## 2017-12-22 ENCOUNTER — Encounter (HOSPITAL_COMMUNITY): Payer: Self-pay | Admitting: Emergency Medicine

## 2017-12-22 ENCOUNTER — Other Ambulatory Visit: Payer: Self-pay

## 2017-12-22 ENCOUNTER — Emergency Department (HOSPITAL_COMMUNITY): Payer: Self-pay

## 2017-12-22 ENCOUNTER — Emergency Department (HOSPITAL_COMMUNITY)
Admission: EM | Admit: 2017-12-22 | Discharge: 2017-12-22 | Disposition: A | Discharge status: Home / Self Care | Payer: Self-pay | Attending: Emergency Medicine | Admitting: Emergency Medicine

## 2017-12-22 DIAGNOSIS — Y999 Unspecified external cause status: Secondary | ICD-10-CM | POA: Insufficient documentation

## 2017-12-22 DIAGNOSIS — S7012XA Contusion of left thigh, initial encounter: Secondary | ICD-10-CM | POA: Insufficient documentation

## 2017-12-22 DIAGNOSIS — Y939 Activity, unspecified: Secondary | ICD-10-CM | POA: Insufficient documentation

## 2017-12-22 DIAGNOSIS — F17210 Nicotine dependence, cigarettes, uncomplicated: Secondary | ICD-10-CM | POA: Insufficient documentation

## 2017-12-22 DIAGNOSIS — Y929 Unspecified place or not applicable: Secondary | ICD-10-CM | POA: Insufficient documentation

## 2017-12-22 DIAGNOSIS — E119 Type 2 diabetes mellitus without complications: Secondary | ICD-10-CM | POA: Insufficient documentation

## 2017-12-22 DIAGNOSIS — S8011XA Contusion of right lower leg, initial encounter: Secondary | ICD-10-CM

## 2017-12-22 DIAGNOSIS — Z79899 Other long term (current) drug therapy: Secondary | ICD-10-CM | POA: Insufficient documentation

## 2017-12-22 DIAGNOSIS — Z7984 Long term (current) use of oral hypoglycemic drugs: Secondary | ICD-10-CM | POA: Insufficient documentation

## 2017-12-22 DIAGNOSIS — W010XXA Fall on same level from slipping, tripping and stumbling without subsequent striking against object, initial encounter: Secondary | ICD-10-CM | POA: Insufficient documentation

## 2017-12-22 NOTE — ED Provider Notes (Signed)
[facility] EMERGENCY DEPARTMENT Provider Note   CSN: [id number] Arrival date & time: 12/22/17  1724     History   Chief Complaint Chief Complaint  Patient presents with  . Leg Pain    HPI Tricia Ramsey is a 62 y.o. female.  HPI Pt tripped over a stool a week ago.  Since then she developed a bruise in her left thigh.  The area is tender to palpation.  She also has some tenderness in the left forearm.  Pt is concerned because the bruising seems to be getting larger.  She does not take any blood thinning medications.  No asa.  Past Medical History:  Diagnosis Date  . Diabetes mellitus     There are no active problems to display for this patient.   Past Surgical History:  Procedure Laterality Date  . ABDOMINAL HYSTERECTOMY    . CHOLECYSTECTOMY    . SHOULDER ARTHROSCOPY W/ ROTATOR CUFF REPAIR       OB History   None      Home Medications    Prior to Admission medications   Medication Sig Start Date End Date Taking? Authorizing Provider  diphenhydrAMINE (BENADRYL) 2 % cream Apply 1 application topically 4 (four) times daily as needed. For sores on arm     [provider]  glipiZIDE (GLUCOTROL) 10 MG tablet Take 1 tablet (10 mg total) by mouth 2 (two) times daily before a meal. 06/30/16   Lyndal Pulley, MD  HYDROcodone-acetaminophen (NORCO/VICODIN) 5-325 MG per tablet Take 1 tablet by mouth every 4 (four) hours as needed. 12/30/13   Mellody Drown, PA-C  LISINOPRIL PO Take by mouth.    [provider]  LOVASTATIN PO Take by mouth.    [provider]  metFORMIN (GLUCOPHAGE) 1000 MG tablet Take 1 tablet (1,000 mg total) by mouth 2 (two) times daily. 06/30/16   Lyndal Pulley, MD  naproxen (NAPROSYN) 500 MG tablet Take 1 tablet (500 mg total) by mouth 2 (two) times daily. 12/30/13   Mellody Drown, PA-C  VENLAFAXINE HCL PO Take by mouth.    [provider]    Family History No family history on file.  Social  History Social History   Tobacco Use  . Smoking status: Current Every Day Smoker    Packs/day: 1.00    Types: Cigarettes  . Smokeless tobacco: Never Used  Substance Use Topics  . Alcohol use: No  . Drug use: No     Allergies   Patient has no known allergies.   Review of Systems Review of Systems  All other systems reviewed and are negative.    Physical Exam Updated Vital Signs BP 124/79 (BP Location: Right Arm)   Pulse 95   Temp 97.8 F (36.6 C) (Oral)   Resp 18   Ht 1.549 m (5\' 1" )   Wt 85.7 kg   SpO2 98%   BMI 35.71 kg/m   Physical Exam  Constitutional: She appears well-developed and well-nourished. No distress.  HENT:  Head: Normocephalic and atraumatic.  Right Ear: External ear normal.  Left Ear: External ear normal.  Eyes: Conjunctivae are normal. Right eye exhibits no discharge. Left eye exhibits no discharge. No scleral icterus.  Neck: Neck supple. No tracheal deviation present.  Cardiovascular: Normal rate.  Pulmonary/Chest: Effort normal. No stridor. No respiratory distress.  Abdominal: She exhibits no distension.  Musculoskeletal: She exhibits tenderness. She exhibits no edema.  approx 10c cm oval Hematoma medial aspect of left thigh, small central  abrasion; small area of ecchymoses mid left forearm  Neurological: She is alert. Cranial nerve deficit: no gross deficits.  Skin: Skin is warm and dry. No rash noted.  Psychiatric: She has a normal mood and affect.  Nursing note and vitals reviewed.    ED Treatments / Results  Labs (all labs ordered are listed, but only abnormal results are displayed) Labs Reviewed - No data to display  EKG None  Radiology Dg Forearm Left  Result Date: 12/22/2017 CLINICAL DATA:  Pain and bruising to the left forearm. EXAM: LEFT FOREARM - 2 VIEW COMPARISON:  None. FINDINGS: There is no evidence of fracture or other focal bone lesions. Soft tissues are unremarkable. IMPRESSION: Negative. Electronically Signed    By: Ted Mcalpine M.D.   On: 12/22/2017 19:45   Dg Femur Min 2 Views Right  Result Date: 12/22/2017 CLINICAL DATA:  Pain post fall. EXAM: RIGHT FEMUR 2 VIEWS COMPARISON:  None. FINDINGS: There is no evidence of fracture or other focal bone lesions. Soft tissues are unremarkable. IMPRESSION: Negative. Electronically Signed   By: Ted Mcalpine M.D.   On: 12/22/2017 19:45    Procedures Procedures (including critical care time)  Medications Ordered in ED Medications - No data to display   Initial Impression / Assessment and Plan / ED Course  I have reviewed the triage vital signs and the nursing notes.  Pertinent labs & imaging results that were available during my care of the patient were reviewed by me and considered in my medical decision making (see chart for details).   X-rays do not show signs of fracture.  Lungs are consistent with a contusion.  Pt reassured.  Supportive care.  Monitor for signs of infection.  Final Clinical Impressions(s) / ED Diagnoses   Final diagnoses:  Contusion of multiple sites of right lower extremity, initial encounter    ED Discharge Orders    None       Linwood Dibbles, MD 12/22/17 2041

## 2017-12-22 NOTE — Discharge Instructions (Addendum)
Take over-the-counter medications such as Tylenol or ibuprofen as needed for pain, bruising may take a few weeks to resolve, monitor for signs of infection such as fever or increasing tenderness, redness

## 2017-12-22 NOTE — ED Provider Notes (Signed)
Patient placed in Quick Look pathway, seen and evaluated   Chief Complaint: Fall, R leg bruising  HPI:   62 year female presents with bruising of the right medial thigh and of the left forearm. She states she tripped over a stool one week ago and the bruising and swelling have been worsening since then. She is not on blood thinners. She has been walking. No knee pain or swelling  ROS: +R thigh pain/bruising, L forearm bruising  Physical Exam:   Gen: No distress  Neuro: Awake and Alert  Skin: Warm    Focused Exam: L forearm: Small bruise over posterior forearm    R thigh: Bruising over the R thigh with associated hematoma. FROM of knee   Initiation of care has begun. The patient has been counseled on the process, plan, and necessity for staying for the completion/evaluation, and the remainder of the medical screening examination    Bethel Born, PA-C 12/22/17 Mcneil Sober, MD 12/22/17 1816

## 2017-12-22 NOTE — ED Triage Notes (Signed)
Patient to ED c/o L medial thigh pain and L forearm pain following mechanical fall at home 6 days ago. She reports she tripped over a stool and landed on the medial side of her R thigh. Noticed large hematoma getting worse since with continued pain. Full ROM all extremities, pulses equal bilaterally.

## 2017-12-22 NOTE — ED Notes (Signed)
Pt stable, ambulatory, states understanding of discharge instructions 

## 2018-01-09 ENCOUNTER — Encounter (INDEPENDENT_AMBULATORY_CARE_PROVIDER_SITE_OTHER): Payer: Self-pay | Admitting: Physician Assistant

## 2018-01-09 ENCOUNTER — Other Ambulatory Visit: Payer: Self-pay

## 2018-01-09 ENCOUNTER — Ambulatory Visit (INDEPENDENT_AMBULATORY_CARE_PROVIDER_SITE_OTHER): Payer: Self-pay | Admitting: Physician Assistant

## 2018-01-09 VITALS — BP 115/80 | HR 104 | Temp 97.8°F | Ht 61.0 in | Wt 175.8 lb

## 2018-01-09 DIAGNOSIS — F418 Other specified anxiety disorders: Secondary | ICD-10-CM

## 2018-01-09 DIAGNOSIS — G250 Essential tremor: Secondary | ICD-10-CM

## 2018-01-09 DIAGNOSIS — R000 Tachycardia, unspecified: Secondary | ICD-10-CM

## 2018-01-09 DIAGNOSIS — B379 Candidiasis, unspecified: Secondary | ICD-10-CM

## 2018-01-09 DIAGNOSIS — E119 Type 2 diabetes mellitus without complications: Secondary | ICD-10-CM

## 2018-01-09 LAB — POCT GLYCOSYLATED HEMOGLOBIN (HGB A1C): Hemoglobin A1C: 12.6 % — AB (ref 4.0–5.6)

## 2018-01-09 MED ORDER — INSULIN GLARGINE 100 UNIT/ML SOLOSTAR PEN: 35.0000 [IU] | PEN_INJECTOR | Freq: Every day | SUBCUTANEOUS | 5 refills | Status: DC

## 2018-01-09 MED ORDER — SITAGLIPTIN PHOSPHATE 50 MG PO TABS: 50.0000 mg | ORAL_TABLET | Freq: Every day | ORAL | 5 refills | Status: DC

## 2018-01-09 MED ORDER — PAROXETINE HCL 40 MG PO TABS: 40.0000 mg | ORAL_TABLET | ORAL | 5 refills | Status: DC

## 2018-01-09 MED ORDER — LOVASTATIN 40 MG PO TABS: 20.0000 mg | ORAL_TABLET | Freq: Every day | ORAL | 5 refills | Status: DC

## 2018-01-09 MED ORDER — PROPRANOLOL HCL 40 MG PO TABS: 40.0000 mg | ORAL_TABLET | Freq: Two times a day (BID) | ORAL | 5 refills | Status: DC

## 2018-01-09 MED ORDER — ARIPIPRAZOLE 5 MG PO TABS: 5.0000 mg | ORAL_TABLET | Freq: Every day | ORAL | 5 refills | Status: DC

## 2018-01-09 MED ORDER — FLUCONAZOLE 150 MG PO TABS: 150.0000 mg | ORAL_TABLET | Freq: Once | ORAL | 0 refills | Status: AC

## 2018-01-09 MED ORDER — LISINOPRIL 2.5 MG PO TABS: 5.0000 mg | ORAL_TABLET | Freq: Every day | ORAL | 5 refills | Status: DC

## 2018-01-09 MED ORDER — PEN NEEDLES 31G X 5 MM MISC: 1.0000 | Freq: Every day | 3 refills | Status: DC

## 2018-01-09 MED ORDER — METFORMIN HCL ER 500 MG PO TB24: 1000.0000 mg | ORAL_TABLET | Freq: Every day | ORAL | 5 refills | Status: DC

## 2018-01-09 MED FILL — LANTUS SOLOSTAR 100 UNITS/M: 100 | 34 days supply | Qty: 12 | Fill #0

## 2018-01-09 MED FILL — TRUEPLUS PEN NDL 31GX3/16": 31G X 5 MM | 25 days supply | Qty: 100 | Fill #0

## 2018-01-09 MED FILL — METFORMIN HCL ER 500 MG TAB: 500 | 30 days supply | Qty: 60 | Fill #0

## 2018-01-09 MED FILL — LISINOPRIL 2.5 MG TABLET: 2.5 | 30 days supply | Qty: 30 | Fill #0

## 2018-01-09 MED FILL — PARoxetine HCL 40 MG TABS: 40 | 30 days supply | Qty: 30 | Fill #0

## 2018-01-09 MED FILL — ARIPiprazole 5 MG TABS: 5 | 30 days supply | Qty: 30 | Fill #0

## 2018-01-09 MED FILL — PROPRANOLOL 40 MG TABLET: 40 | 30 days supply | Qty: 60 | Fill #0

## 2018-01-09 MED FILL — LOVASTATIN 40 MG TABS: 40 | 30 days supply | Qty: 15 | Fill #0

## 2018-01-09 MED FILL — !JANUVIA 50 MG TABLET: 50 | 30 days supply | Qty: 30 | Fill #0

## 2018-01-09 MED FILL — TRUEPLUS PEN NDL 31GX3/16: 31G X 5 MM | 25 days supply | Qty: 100 | Fill #0

## 2018-01-09 NOTE — Progress Notes (Signed)
Subjective:  Patient ID: Tricia Ramsey, female    DOB: [birth year]  Age: 62 y.o. MRN: [id number]  CC:   HPI Tricia Ramsey is a 62 y.o. female with a medical history of HTN, DM2, HLD, tinea pedis, depression, and chronic fatigue presents as a new patient for complaint of vaginal itching and burning. No A1c on file but has reached CBG of >600 mg/dL approximately six months ago. Checks blood sugars at home and usually sees 200s to 300s. A1c 12.6% in clinic today.     PHQ9 19 and GAD7 13 in clinic today. Reportedly taking Paroxetine 40 mg and Wellbutrin 100 mg as directed. Paroxetine has been mild to moderately effective. Says Wellbutrin is ineffective.      Outpatient Medications Prior to Visit  Medication Sig Dispense Refill  . buPROPion (WELLBUTRIN) 100 MG tablet Take 100 mg by mouth 2 (two) times daily.    Marland Kitchen glipiZIDE (GLUCOTROL) 10 MG tablet Take 1 tablet (10 mg total) by mouth 2 (two) times daily before a meal. 60 tablet 0  . insulin NPH-regular Human (70-30) 100 UNIT/ML injection Inject into the skin 2 (two) times daily with a meal.    . LISINOPRIL PO Take 5 mg by mouth daily.     Marland Kitchen LOVASTATIN PO Take 20 mg by mouth at bedtime.     Marland Kitchen PARoxetine (PAXIL) 40 MG tablet Take 40 mg by mouth every morning.    . diphenhydrAMINE (BENADRYL) 2 % cream Apply 1 application topically 4 (four) times daily as needed. For sores on arm     . VENLAFAXINE HCL PO Take by mouth.    Marland Kitchen HYDROcodone-acetaminophen (NORCO/VICODIN) 5-325 MG per tablet Take 1 tablet by mouth every 4 (four) hours as needed. 10 tablet 0  . metFORMIN (GLUCOPHAGE) 1000 MG tablet Take 1 tablet (1,000 mg total) by mouth 2 (two) times daily. 60 tablet 0  . naproxen (NAPROSYN) 500 MG tablet Take 1 tablet (500 mg total) by mouth 2 (two) times daily. 30 tablet 0   No facility-administered medications prior to visit.      ROS Review of Systems  Constitutional: Negative for chills, fever and malaise/fatigue.  Eyes: Negative for blurred  vision.  Respiratory: Negative for shortness of breath.   Cardiovascular: Negative for chest pain and palpitations.  Gastrointestinal: Negative for abdominal pain and nausea.  Genitourinary: Negative for dysuria and hematuria.       Vaginal itching  Musculoskeletal: Negative for joint pain and myalgias.  Skin: Negative for rash.  Neurological: Positive for tremors. Negative for tingling and headaches.  Psychiatric/Behavioral: Positive for depression. The patient is nervous/anxious.     Objective:  BP 115/80 (BP Location: Right Arm, Patient Position: Sitting, Cuff Size: Large)   Pulse (!) 104   Temp 97.8 F (36.6 C) (Oral)   Ht 5\' 1"  (1.549 m)   Wt 175 lb 12.8 oz (79.7 kg)   SpO2 94%   BMI 33.22 kg/m   BP/Weight 01/09/2018 12/22/2017 06/15/2017  Systolic BP 115 122 107  Diastolic BP 80 79 71  Wt. (Lbs) 175.8 189 180  BMI 33.22 35.71 34.01      Physical Exam  Constitutional: She is oriented to person, place, and time.  Well developed, well nourished, NAD, polite  HENT:  Head: Normocephalic and atraumatic.  Eyes: No scleral icterus.  Neck: Normal range of motion. Neck supple. No thyromegaly present.  Cardiovascular: Regular rhythm and normal heart sounds.  tachycardia  Pulmonary/Chest: Effort normal and breath sounds normal.  Musculoskeletal: She exhibits no edema.  Neurological: She is alert and oriented to person, place, and time.  Mild essential tremor of the right hand noted.   Skin: Skin is warm and dry. No rash noted. No erythema. No pallor.  Psychiatric: She has a normal mood and affect. Her behavior is normal. Thought content normal.  Vitals reviewed.    Assessment & Plan:    1. Type 2 diabetes mellitus without complication, without long-term current use of insulin (HCC) - HgB A1c 12.6% - Begin Insulin Glargine (LANTUS SOLOSTAR) 100 UNIT/ML Solostar Pen; Inject 35 Units into the skin daily.  Dispense: 4 pen; Refill: 5 - Begin Insulin Pen Needle (PEN  NEEDLES) 31G X 5 MM MISC; Inject 1 each into the skin at bedtime.  Dispense: 100 each; Refill: 3 - Begin metFORMIN (GLUCOPHAGE XR) 500 MG 24 hr tablet; Take 2 tablets (1,000 mg total) by mouth daily with breakfast.  Dispense: 60 tablet; Refill: 5. Last serum creatinine 1.22. - Begin sitaGLIPtin (JANUVIA) 50 MG tablet; Take 1 tablet (50 mg total) by mouth daily.  Dispense: 30 tablet; Refill: 5 - Refill lovastatin (MEVACOR) 40 MG tablet; Take 0.5 tablets (20 mg total) by mouth at bedtime.  Dispense: 30 tablet; Refill: 5 - Decrease lisinopril (PRINIVIL,ZESTRIL) 2.5 MG tablet; Take 2 tablets (5 mg total) by mouth daily.  Dispense: 30 tablet; Refill: 5 - Lipid panel; Future - Comprehensive metabolic panel - TSH  2. Tachycardia - Begin propranolol (INDERAL) 40 MG tablet; Take 1 tablet (40 mg total) by mouth 2 (two) times daily.  Dispense: 60 tablet; Refill: 5 - Comprehensive metabolic panel - TSH  3. Depression with anxiety - Refill PARoxetine (PAXIL) 40 MG tablet; Take 1 tablet (40 mg total) by mouth every morning.  Dispense: 30 tablet; Refill: 5 - Begin ARIPiprazole (ABILIFY) 5 MG tablet; Take 1 tablet (5 mg total) by mouth daily.  Dispense: 30 tablet; Refill: 5  4. Essential tremor - Begin propranolol (INDERAL) 40 MG tablet; Take 1 tablet (40 mg total) by mouth 2 (two) times daily.  Dispense: 60 tablet; Refill: 5 - Advised patient to monitor BP while on propranolol - Comprehensive metabolic panel - TSH  5. Yeast infection - Begin fluconazole (DIFLUCAN) 150 MG tablet; Take 1 tablet (150 mg total) by mouth once for 1 dose.  Dispense: 1 tablet; Refill: 0   Meds ordered this encounter  Medications  . Insulin Glargine (LANTUS SOLOSTAR) 100 UNIT/ML Solostar Pen    Sig: Inject 35 Units into the skin daily.    Dispense:  4 pen    Refill:  5    Order Specific Question:   Supervising Provider    Answer:   Hoy Register [[id number]]  . Insulin Pen Needle (PEN NEEDLES) 31G X 5 MM MISC    Sig:  Inject 1 each into the skin at bedtime.    Dispense:  100 each    Refill:  3    Order Specific Question:   Supervising Provider    Answer:   Hoy Register [[id number]]  . PARoxetine (PAXIL) 40 MG tablet    Sig: Take 1 tablet (40 mg total) by mouth every morning.    Dispense:  30 tablet    Refill:  5    Order Specific Question:   Supervising Provider    Answer:   Hoy Register [[id number]]  . metFORMIN (GLUCOPHAGE XR) 500 MG 24 hr tablet    Sig: Take 2 tablets (1,000 mg total) by mouth daily with breakfast.  Dispense:  60 tablet    Refill:  5    Order Specific Question:   Supervising Provider    Answer:   Hoy Register [[id number]]  . sitaGLIPtin (JANUVIA) 50 MG tablet    Sig: Take 1 tablet (50 mg total) by mouth daily.    Dispense:  30 tablet    Refill:  5    Order Specific Question:   Supervising Provider    Answer:   Hoy Register [[id number]]  . propranolol (INDERAL) 40 MG tablet    Sig: Take 1 tablet (40 mg total) by mouth 2 (two) times daily.    Dispense:  60 tablet    Refill:  5    Order Specific Question:   Supervising Provider    Answer:   Hoy Register [[id number]]  . lovastatin (MEVACOR) 40 MG tablet    Sig: Take 0.5 tablets (20 mg total) by mouth at bedtime.    Dispense:  30 tablet    Refill:  5    Order Specific Question:   Supervising Provider    Answer:   Hoy Register [[id number]]  . lisinopril (PRINIVIL,ZESTRIL) 2.5 MG tablet    Sig: Take 2 tablets (5 mg total) by mouth daily.    Dispense:  30 tablet    Refill:  5    Order Specific Question:   Supervising Provider    Answer:   Hoy Register [[id number]]  . ARIPiprazole (ABILIFY) 5 MG tablet    Sig: Take 1 tablet (5 mg total) by mouth daily.    Dispense:  30 tablet    Refill:  5    Order Specific Question:   Supervising Provider    Answer:   Hoy Register [[id number]]  . fluconazole (DIFLUCAN) 150 MG tablet    Sig: Take 1 tablet (150 mg total) by mouth once for 1 dose.    Dispense:  1 tablet    Refill:  0    Order Specific  Question:   Supervising Provider    Answer:   Hoy Register [[id number]]    Follow-up: Return in about 4 weeks (around 02/06/2018) for Glucose log review.   Loletta Specter PA

## 2018-01-09 NOTE — Patient Instructions (Signed)
Diabetes Mellitus and Nutrition When you have diabetes (diabetes mellitus), it is very important to have healthy eating habits because your blood sugar (glucose) levels are greatly affected by what you eat and drink. Eating healthy foods in the appropriate amounts, at about the same times every day, can help you:  Control your blood glucose.  Lower your risk of heart disease.  Improve your blood pressure.  Reach or maintain a healthy weight.  Every person with diabetes is different, and each person has different needs for a meal plan. Your health care provider may recommend that you work with a diet and nutrition specialist (dietitian) to make a meal plan that is best for you. Your meal plan may vary depending on factors such as:  The calories you need.  The medicines you take.  Your weight.  Your blood glucose, blood pressure, and cholesterol levels.  Your activity level.  Other health conditions you have, such as heart or kidney disease.  How do carbohydrates affect me? Carbohydrates affect your blood glucose level more than any other type of food. Eating carbohydrates naturally increases the amount of glucose in your blood. Carbohydrate counting is a method for keeping track of how many carbohydrates you eat. Counting carbohydrates is important to keep your blood glucose at a healthy level, especially if you use insulin or take certain oral diabetes medicines. It is important to know how many carbohydrates you can safely have in each meal. This is different for every person. Your dietitian can help you calculate how many carbohydrates you should have at each meal and for snack. Foods that contain carbohydrates include:  Bread, cereal, rice, pasta, and crackers.  Potatoes and corn.  Peas, beans, and lentils.  Milk and yogurt.  Fruit and juice.  Desserts, such as cakes, cookies, ice cream, and candy.  How does alcohol affect me? Alcohol can cause a sudden decrease in blood  glucose (hypoglycemia), especially if you use insulin or take certain oral diabetes medicines. Hypoglycemia can be a life-threatening condition. Symptoms of hypoglycemia (sleepiness, dizziness, and confusion) are similar to symptoms of having too much alcohol. If your health care provider says that alcohol is safe for you, follow these guidelines:  Limit alcohol intake to no more than 1 drink per day for nonpregnant women and 2 drinks per day for men. One drink equals 12 oz of beer, 5 oz of wine, or 1 oz of hard liquor.  Do not drink on an empty stomach.  Keep yourself hydrated with water, diet soda, or unsweetened iced tea.  Keep in mind that regular soda, juice, and other mixers may contain a lot of sugar and must be counted as carbohydrates.  What are tips for following this plan? Reading food labels  Start by checking the serving size on the label. The amount of calories, carbohydrates, fats, and other nutrients listed on the label are based on one serving of the food. Many foods contain more than one serving per package.  Check the total grams (g) of carbohydrates in one serving. You can calculate the number of servings of carbohydrates in one serving by dividing the total carbohydrates by 15. For example, if a food has 30 g of total carbohydrates, it would be equal to 2 servings of carbohydrates.  Check the number of grams (g) of saturated and trans fats in one serving. Choose foods that have low or no amount of these fats.  Check the number of milligrams (mg) of sodium in one serving. Most people   should limit total sodium intake to less than 2,300 mg per day.  Always check the nutrition information of foods labeled as "low-fat" or "nonfat". These foods may be higher in added sugar or refined carbohydrates and should be avoided.  Talk to your dietitian to identify your daily goals for nutrients listed on the label. Shopping  Avoid buying canned, premade, or processed foods. These  foods tend to be high in fat, sodium, and added sugar.  Shop around the outside edge of the grocery store. This includes fresh fruits and vegetables, bulk grains, fresh meats, and fresh dairy. Cooking  Use low-heat cooking methods, such as baking, instead of high-heat cooking methods like deep frying.  Cook using healthy oils, such as olive, canola, or sunflower oil.  Avoid cooking with butter, cream, or high-fat meats. Meal planning  Eat meals and snacks regularly, preferably at the same times every day. Avoid going long periods of time without eating.  Eat foods high in fiber, such as fresh fruits, vegetables, beans, and whole grains. Talk to your dietitian about how many servings of carbohydrates you can eat at each meal.  Eat 4-6 ounces of lean protein each day, such as lean meat, chicken, fish, eggs, or tofu. 1 ounce is equal to 1 ounce of meat, chicken, or fish, 1 egg, or 1/4 cup of tofu.  Eat some foods each day that contain healthy fats, such as avocado, nuts, seeds, and fish. Lifestyle   Check your blood glucose regularly.  Exercise at least 30 minutes 5 or more days each week, or as told by your health care provider.  Take medicines as told by your health care provider.  Do not use any products that contain nicotine or tobacco, such as cigarettes and e-cigarettes. If you need help quitting, ask your health care provider.  Work with a counselor or diabetes educator to identify strategies to manage stress and any emotional and social challenges. What are some questions to ask my health care provider?  Do I need to meet with a diabetes educator?  Do I need to meet with a dietitian?  What number can I call if I have questions?  When are the best times to check my blood glucose? Where to find more information:  American Diabetes Association: diabetes.org/food-and-fitness/food  Academy of Nutrition and Dietetics:  www.eatright.org/resources/health/diseases-and-conditions/diabetes  National Institute of Diabetes and Digestive and Kidney Diseases (NIH): www.niddk.nih.gov/health-information/diabetes/overview/diet-eating-physical-activity Summary  A healthy meal plan will help you control your blood glucose and maintain a healthy lifestyle.  Working with a diet and nutrition specialist (dietitian) can help you make a meal plan that is best for you.  Keep in mind that carbohydrates and alcohol have immediate effects on your blood glucose levels. It is important to count carbohydrates and to use alcohol carefully. This information is not intended to replace advice given to you by your health care provider. Make sure you discuss any questions you have with your health care provider. Document Released: 11/19/2004 Document Revised: 03/29/2016 Document Reviewed: 03/29/2016 Elsevier Interactive Patient Education  2018 Elsevier Inc.  

## 2018-01-10 ENCOUNTER — Telehealth (INDEPENDENT_AMBULATORY_CARE_PROVIDER_SITE_OTHER): Payer: Self-pay

## 2018-01-10 MED FILL — FLUCONAZOLE 150 MG TABS: 150 | 1 days supply | Qty: 1 | Fill #0

## 2018-01-10 NOTE — Telephone Encounter (Signed)
-----   Message from Loletta Specter, PA-C sent at 01/10/2018  1:42 PM EST ----- Labs as expected for someone with uncontrolled DM. Thyroid normal.

## 2018-01-10 NOTE — Telephone Encounter (Signed)
Patient is aware that labs are as to be expected for someone with uncontrolled DM. Encouraged patient to take medications as directed. Patient also aware that thyroid is normal. Maryjean Morn, CMA

## 2018-01-11 LAB — COMPREHENSIVE METABOLIC PANEL
A/G RATIO: 1.5 (ref 1.2–2.2)
ALBUMIN: 4.4 g/dL (ref 3.6–4.8)
ALT: 15 IU/L (ref 0–32)
AST: 12 IU/L (ref 0–40)
Alkaline Phosphatase: 99 IU/L (ref 39–117)
BUN / CREAT RATIO: 17 (ref 12–28)
BUN: 18 mg/dL (ref 8–27)
Bilirubin Total: 0.3 mg/dL (ref 0.0–1.2)
CO2: 19 mmol/L — ABNORMAL LOW (ref 20–29)
Calcium: 9.4 mg/dL (ref 8.7–10.3)
Chloride: 96 mmol/L (ref 96–106)
Creatinine, Ser: 1.09 mg/dL — ABNORMAL HIGH (ref 0.57–1.00)
GFR calc Af Amer: 64 mL/min/{1.73_m2} (ref >59)
GFR calc non Af Amer: 55 mL/min/{1.73_m2} — ABNORMAL LOW (ref >59)
Globulin, Total: 3 g/dL (ref 1.5–4.5)
Glucose: 519 mg/dL (ref 65–99)
POTASSIUM: 3.7 mmol/L (ref 3.5–5.2)
Sodium: 132 mmol/L — ABNORMAL LOW (ref 134–144)
TOTAL PROTEIN: 7.4 g/dL (ref 6.0–8.5)

## 2018-01-11 LAB — TSH: TSH: 0.616 u[IU]/mL (ref 0.450–4.500)

## 2018-01-18 ENCOUNTER — Other Ambulatory Visit (INDEPENDENT_AMBULATORY_CARE_PROVIDER_SITE_OTHER): Payer: Self-pay

## 2018-01-18 DIAGNOSIS — E119 Type 2 diabetes mellitus without complications: Secondary | ICD-10-CM

## 2018-01-18 NOTE — Progress Notes (Signed)
Labs collected by onsite [facility] phlebotomist. Yamili Lichtenwalner S Clydell Alberts, CMA  

## 2018-01-19 ENCOUNTER — Other Ambulatory Visit (INDEPENDENT_AMBULATORY_CARE_PROVIDER_SITE_OTHER): Payer: Self-pay | Admitting: Physician Assistant

## 2018-01-19 ENCOUNTER — Telehealth (INDEPENDENT_AMBULATORY_CARE_PROVIDER_SITE_OTHER): Payer: Self-pay

## 2018-01-19 LAB — LIPID PANEL
CHOL/HDL RATIO: 2.9 ratio (ref 0.0–4.4)
Cholesterol, Total: 155 mg/dL (ref 100–199)
HDL: 54 mg/dL (ref >39)
LDL Calculated: 79 mg/dL (ref 0–99)
TRIGLYCERIDES: 109 mg/dL (ref 0–149)
VLDL Cholesterol Cal: 22 mg/dL (ref 5–40)

## 2018-01-19 NOTE — Telephone Encounter (Signed)
-----   Message from Loletta Specteroger David Gomez, PA-C sent at 01/19/2018  8:46 AM EST ----- Cholesterol is normal. Keep taking lovastatin as directed.

## 2018-01-19 NOTE — Telephone Encounter (Signed)
Patient is aware that cholesterol is normal. Advised patient to continue taking Lovastatin as directed. Maryjean Mornempestt S Kaleesi Guyton, CMA

## 2018-02-07 ENCOUNTER — Encounter (INDEPENDENT_AMBULATORY_CARE_PROVIDER_SITE_OTHER): Payer: Self-pay | Admitting: Physician Assistant

## 2018-02-07 ENCOUNTER — Encounter: Payer: Self-pay | Admitting: Neurology

## 2018-02-07 ENCOUNTER — Ambulatory Visit (INDEPENDENT_AMBULATORY_CARE_PROVIDER_SITE_OTHER): Payer: Self-pay | Admitting: Physician Assistant

## 2018-02-07 ENCOUNTER — Other Ambulatory Visit: Payer: Self-pay

## 2018-02-07 VITALS — BP 116/79 | HR 79 | Temp 97.9°F | Ht 61.0 in | Wt 177.6 lb

## 2018-02-07 DIAGNOSIS — R000 Tachycardia, unspecified: Secondary | ICD-10-CM

## 2018-02-07 DIAGNOSIS — E119 Type 2 diabetes mellitus without complications: Secondary | ICD-10-CM

## 2018-02-07 DIAGNOSIS — F418 Other specified anxiety disorders: Secondary | ICD-10-CM

## 2018-02-07 DIAGNOSIS — G250 Essential tremor: Secondary | ICD-10-CM

## 2018-02-07 MED ORDER — INSULIN GLARGINE 100 UNIT/ML SOLOSTAR PEN: 45.0000 [IU] | PEN_INJECTOR | SUBCUTANEOUS | 5 refills | Status: DC

## 2018-02-07 MED ORDER — SITAGLIPTIN PHOSPHATE 100 MG PO TABS: 100.0000 mg | ORAL_TABLET | Freq: Every day | ORAL | 5 refills | Status: DC

## 2018-02-07 MED FILL — LISINOPRIL 2.5 MG TABLET: 2.5 | 15 days supply | Qty: 30 | Fill #1

## 2018-02-07 MED FILL — PARoxetine HCL 40 MG TABS: 40 | 30 days supply | Qty: 30 | Fill #1

## 2018-02-07 MED FILL — METFORMIN HCL ER 500 MG TAB: 500 | 30 days supply | Qty: 60 | Fill #1

## 2018-02-07 MED FILL — !LANTUS SOLOSTAR 100UNITS/M: 100 | 19 days supply | Qty: 9 | Fill #0

## 2018-02-07 MED FILL — !JANUVIA 100MG TABLET: 100 | 30 days supply | Qty: 30 | Fill #0

## 2018-02-07 MED FILL — PROPRANOLOL 40 MG TABLET: 40 | 30 days supply | Qty: 60 | Fill #1

## 2018-02-07 NOTE — Patient Instructions (Signed)
Tremor A tremor is trembling or shaking that you cannot control. Most tremors affect the hands or arms. Tremors can also affect the head, vocal cords, face, and other parts of the body. There are many types of tremors. Common types include:  Essential tremor. These usually occur in people over the age of 40. It may run in families and can happen in otherwise healthy people.  Resting tremor. These occur when the muscles are at rest, such as when your hands are resting in your lap. People with Parkinson disease often have resting tremors.  Postural tremor. These occur when you try to hold a pose, such as keeping your hands outstretched.  Kinetic tremor. These occur during purposeful movement, such as trying to touch a finger to your nose.  Task-specific tremor. These may occur when you perform tasks such as handwriting, speaking, or standing.  Psychogenic tremor. These dramatically lessen or disappear when you are distracted. They can happen in people of all ages.  Some types of tremors have no known cause. Tremors can also be a symptom of nervous system problems (neurological disorders) that may occur with aging. Some tremors go away with treatment while others do not. Follow these instructions at home: Watch your tremor for any changes. The following actions may help to lessen any discomfort you are feeling:  Take medicines only as directed by your health care provider.  Limit alcohol intake to no more than 1 drink per day for nonpregnant women and 2 drinks per day for men. One drink equals 12 oz of beer, 5 oz of wine, or 1 oz of hard liquor.  Do not use any tobacco products, including cigarettes, chewing tobacco, or electronic cigarettes. If you need help quitting, ask your health care provider.  Avoid extreme heat or cold.  Limit the amount of caffeine you consumeas directed by your health care provider.  Try to get 8 hours of sleep each night.  Find ways to manage your stress,  such as meditation or yoga.  Keep all follow-up visits as directed by your health care provider. This is important.  Contact a health care provider if:  You start having a tremor after starting a new medicine.  You have tremor with other symptoms such as: ? Numbness. ? Tingling. ? Pain. ? Weakness.  Your tremor gets worse.  Your tremor interferes with your day-to-day life. This information is not intended to replace advice given to you by your health care provider. Make sure you discuss any questions you have with your health care provider. Document Released: 02/12/2002 Document Revised: 10/26/2015 Document Reviewed: 08/20/2013 Elsevier Interactive Patient Education  2018 Elsevier Inc.  

## 2018-02-07 NOTE — Progress Notes (Signed)
Subjective:  Patient ID: Tricia Ramsey, female    DOB: 19-Jan-1956  Age: 62 y.o. MRN: [id number]  CC:  Glucose log review   HPI Tricia Ramsey is a 62 y.o. female with a medical history of HTN, DM2, HLD, tinea pedis, depression, and chronic fatigue presents for glucose log review. Last seen here one month ago with an A1c was 12.6%. Pt was instructed to begin Lantus 35 units qhs, Metformin 1000 mg XR, and Januvia 50 mg qday. Glucometer log ranges from 92 to 457 with all readings being higher in the evenings. Overall feels better, does not endorse polydipsia, polyuria, visual blurring, or excessive fatigue.    Pt states her hand tremor is worse now that she is on propranolol. Family members have noticed involuntary movement of the jaw which was not present previously. Does not endorse any other neurological deficits.     Outpatient Medications Prior to Visit  Medication Sig Dispense Refill  . ARIPiprazole (ABILIFY) 5 MG tablet Take 1 tablet (5 mg total) by mouth daily. 30 tablet 5  . Insulin Glargine (LANTUS SOLOSTAR) 100 UNIT/ML Solostar Pen Inject 35 Units into the skin daily. 4 pen 5  . Insulin Pen Needle (PEN NEEDLES) 31G X 5 MM MISC Inject 1 each into the skin at bedtime. 100 each 3  . lisinopril (PRINIVIL,ZESTRIL) 2.5 MG tablet Take 2 tablets (5 mg total) by mouth daily. 30 tablet 5  . lovastatin (MEVACOR) 40 MG tablet Take 0.5 tablets (20 mg total) by mouth at bedtime. 30 tablet 5  . metFORMIN (GLUCOPHAGE XR) 500 MG 24 hr tablet Take 2 tablets (1,000 mg total) by mouth daily with breakfast. 60 tablet 5  . PARoxetine (PAXIL) 40 MG tablet Take 1 tablet (40 mg total) by mouth every morning. 30 tablet 5  . propranolol (INDERAL) 40 MG tablet Take 1 tablet (40 mg total) by mouth 2 (two) times daily. 60 tablet 5  . sitaGLIPtin (JANUVIA) 50 MG tablet Take 1 tablet (50 mg total) by mouth daily. 30 tablet 5   No facility-administered medications prior to visit.      ROS Review of Systems   Constitutional: Negative for chills, fever and malaise/fatigue.  Eyes: Negative for blurred vision.  Respiratory: Negative for shortness of breath.   Cardiovascular: Negative for chest pain and palpitations.  Gastrointestinal: Negative for abdominal pain and nausea.  Genitourinary: Negative for dysuria and hematuria.  Musculoskeletal: Negative for joint pain and myalgias.  Skin: Negative for rash.  Neurological: Positive for tremors. Negative for tingling and headaches.  Psychiatric/Behavioral: Negative for depression. The patient is not nervous/anxious.     Objective:  BP 116/79 (BP Location: Right Arm, Patient Position: Sitting, Cuff Size: Normal)   Pulse 79   Temp 97.9 F (36.6 C) (Oral)   Ht 5\' 1"  (1.549 m)   Wt 177 lb 9.6 oz (80.6 kg)   SpO2 94%   BMI 33.56 kg/m   BP/Weight 02/07/2018 01/09/2018 12/22/2017  Systolic BP 116 115 122  Diastolic BP 79 80 79  Wt. (Lbs) 177.6 175.8 189  BMI 33.56 33.22 35.71      Physical Exam  Constitutional: She is oriented to person, place, and time.  Well developed, well nourished, NAD, polite  HENT:  Head: Normocephalic and atraumatic.  Eyes: No scleral icterus.  Neck: Normal range of motion. Neck supple. No thyromegaly present.  Cardiovascular: Normal rate, regular rhythm and normal heart sounds.  Pulmonary/Chest: Effort normal and breath sounds normal.  Musculoskeletal: She exhibits no  edema.  Neurological: She is alert and oriented to person, place, and time. No cranial nerve deficit.  Tremor of the hands and of the mandible.  Skin: Skin is warm and dry. No rash noted. No erythema. No pallor.  Psychiatric: She has a normal mood and affect. Her behavior is normal. Thought content normal.  Vitals reviewed.    Assessment & Plan:   1. Type 2 diabetes mellitus without complication, without long-term current use of insulin (HCC) - Increase and change to morning injections, Insulin Glargine (LANTUS SOLOSTAR) 100 UNIT/ML Solostar  Pen; Inject 45 Units into the skin every morning.  Dispense: 5 pen; Refill: 5 - Increase sitaGLIPtin (JANUVIA) 100 MG tablet; Take 1 tablet (100 mg total) by mouth daily.  Dispense: 30 tablet; Refill: 5  2. Tachycardia - Resolved with Propranolol use  3. Essential tremor - Ambulatory referral to Neurology - Continue Propranolol as directed - TSH and Ca normal.   4. Depression with anxiety - Stop Abilify as this may be causing involuntary movement/tremor of the jaw.  Meds ordered this encounter  Medications  . Insulin Glargine (LANTUS SOLOSTAR) 100 UNIT/ML Solostar Pen    Sig: Inject 45 Units into the skin every morning.    Dispense:  5 pen    Refill:  5    Order Specific Question:   Supervising Provider    Answer:   Hoy Register [[id number]]  . sitaGLIPtin (JANUVIA) 100 MG tablet    Sig: Take 1 tablet (100 mg total) by mouth daily.    Dispense:  30 tablet    Refill:  5    Order Specific Question:   Supervising Provider    Answer:   Hoy Register [[id number]]    Follow-up: Return in about 6 weeks (around 03/21/2018) for tremor.   Loletta Specter PA

## 2018-02-16 MED FILL — LOVASTATIN 40 MG TABS: 40 | 30 days supply | Qty: 15 | Fill #1

## 2018-02-20 MED FILL — !LANTUS SOLOSTAR 100UNITS/M: 100 | 19 days supply | Qty: 9 | Fill #1

## 2018-02-23 NOTE — Progress Notes (Signed)
Tricia Ramsey was seen today in the movement disorders clinic for neurologic consultation at the request of Loletta Specter, PA-C.  The consultation is for the evaluation of tremor.  Tremor: Yes.     How long has it been going on? 1 year  At rest or with activation?  rest  When is it noted the most?  sitting  Fam hx of tremor?  Yes.  mother and sister, unknown dx  Located where?  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    Affected by fatigue:  No.  Spills soup if on spoon:  No.  Spills glass of liquid if full:  No.  Affects ADL's (tying shoes, brushing teeth, etc):  No.  Tremor inducing meds:  yes, abilify.  Just d/c on 12/3 due to tremor (was only on for a month) and tremor started prior to start of abilify per records.  In fact 11/4 records indicate "mild essential tremor of right hand."  Was started on propranolol for tremor and tachycardia.  Currently on 40 mg bid  Other Specific Symptoms:  Voice: more loud per pt/husband Sleep: some awakening in middle of the night  Vivid Dreams:  No.  Acting out dreams:  No. Wet Pillows: No. Postural symptoms:  intermittently  Falls?  No. Bradykinesia symptoms: no bradykinesia noted Loss of smell:  Some trouble but seems to come and go Loss of taste:  No. Urinary Incontinence:  No. Difficulty Swallowing:  No. Handwriting, micrographia: No., just "sloppier" Depression:  Yes.   but more anxious, no SI/HI Memory changes:  No. Hallucinations:  No.  visual distortions: Yes.   N/V:  No. Lightheaded:  No.  Syncope: No. Diplopia:  No. Dyskinesia:  No.    Last neuroimaging was a CT head in 2000.  Images not available.  PREVIOUS MEDICATIONS: propranolol  ALLERGIES:  No Known Allergies  CURRENT MEDICATIONS:  Outpatient Encounter Medications as of 03/06/2018  Medication Sig  . Insulin Glargine  (LANTUS SOLOSTAR) 100 UNIT/ML Solostar Pen Inject 45 Units into the skin every morning.  . Insulin Pen Needle (PEN NEEDLES) 31G X 5 MM MISC Inject 1 each into the skin at bedtime.  Marland Kitchen lisinopril (PRINIVIL,ZESTRIL) 2.5 MG tablet Take 2 tablets (5 mg total) by mouth daily.  Marland Kitchen lovastatin (MEVACOR) 40 MG tablet Take 0.5 tablets (20 mg total) by mouth at bedtime.  . metFORMIN (GLUCOPHAGE XR) 500 MG 24 hr tablet Take 2 tablets (1,000 mg total) by mouth daily with breakfast.  . PARoxetine (PAXIL) 40 MG tablet Take 1 tablet (40 mg total) by mouth every morning.  . propranolol (INDERAL) 40 MG tablet Take 1 tablet (40 mg total) by mouth 2 (two) times daily.  . sitaGLIPtin (JANUVIA) 100 MG tablet Take 1 tablet (100 mg total) by mouth daily.   No facility-administered encounter medications on file as of 03/06/2018.     PAST MEDICAL HISTORY:   Past Medical History:  Diagnosis Date  . Anxiety   . Diabetes mellitus     PAST SURGICAL HISTORY:   Past Surgical History:  Procedure Laterality Date  . ABDOMINAL HYSTERECTOMY    . CHOLECYSTECTOMY    . SHOULDER ARTHROSCOPY W/ ROTATOR CUFF REPAIR      SOCIAL HISTORY:   Social History   Socioeconomic History  . Marital status: Married    Spouse name: Not on  file  . Number of children: Not on file  . Years of education: Not on file  . Highest education level: Not on file  Occupational History  . Not on file  Social Needs  . Financial resource strain: Not on file  . Food insecurity:    Worry: Not on file    Inability: Not on file  . Transportation needs:    Medical: Not on file    Non-medical: Not on file  Tobacco Use  . Smoking status: Current Every Day Smoker    Packs/day: 1.00    Types: Cigarettes  . Smokeless tobacco: Never Used  Substance and Sexual Activity  . Alcohol use: No  . Drug use: No  . Sexual activity: Not on file  Lifestyle  . Physical activity:    Days per week: Not on file    Minutes per session: Not on file  . Stress:  Not on file  Relationships  . Social connections:    Talks on phone: Not on file    Gets together: Not on file    Attends religious service: Not on file    Active member of club or organization: Not on file    Attends meetings of clubs or organizations: Not on file    Relationship status: Not on file  . Intimate partner violence:    Fear of current or ex partner: Not on file    Emotionally abused: Not on file    Physically abused: Not on file    Forced sexual activity: Not on file  Other Topics Concern  . Not on file  Social History Narrative  . Not on file    FAMILY HISTORY:   Family Status  Relation Name Status  . Mother  Alive  . Father  Alive  . Sister  Deceased  . Brother  Alive  . Son  Alive    ROS:  Review of Systems  Constitutional: Negative.   HENT: Negative.   Eyes: Negative.   Respiratory: Positive for shortness of breath (intermittent).   Cardiovascular: Negative.   Gastrointestinal: Negative.   Genitourinary: Negative.   Skin: Negative.   Endo/Heme/Allergies: Negative.     PHYSICAL EXAMINATION:    VITALS:   Vitals:   03/06/18 0859  BP: 138/84  Pulse: 76  SpO2: 95%  Weight: 181 lb (82.1 kg)  Height: 5\' 1"  (1.549 m)    GEN:  The patient appears stated age and is in NAD. HEENT:  Normocephalic, atraumatic.  The mucous membranes are moist. The superficial temporal arteries are without ropiness or tenderness. CV:  RRR Lungs:  CTAB Neck/HEME:  There are no carotid bruits bilaterally.  Neurological examination:  Orientation: The patient is alert and oriented x3. Fund of knowledge is appropriate.  Recent and remote memory are intact.  Attention and concentration are normal.    Able to name objects and repeat phrases. Cranial nerves: There is good facial symmetry. Pupils are equal round and reactive to light bilaterally. Fundoscopic exam reveals clear margins bilaterally. Extraocular muscles are intact. The visual fields are full to confrontational  testing. The speech is fluent and clear. Soft palate rises symmetrically and there is no tongue deviation. Hearing is intact to conversational tone. Sensation: Sensation is intact to light and pinprick throughout (facial, trunk, extremities). Vibration is intact at the bilateral big toe. There is no extinction with double simultaneous stimulation. There is no sensory dermatomal level identified. Motor: Strength is 5/5 in the bilateral upper and lower extremities.  Shoulder shrug is equal and symmetric.  There is no pronator drift. Deep tendon reflexes: Deep tendon reflexes are 2+/4 at the bilateral biceps, triceps, brachioradialis, patella and achilles. Plantar responses are downgoing bilaterally.  Movement examination: Tone: There is normal tone in the upper and lower extremities. Abnormal movements: There is mild tremor of the outstretched hands on both hands.  Tremor remains when given a weight.  Archimedes spirals are drawn well bilaterally.  She has no trouble when asked to pour water from one glass to another.  Very rarely, rest tremor is seen in the right hand. Coordination:  There is no decremation with RAM's, with any form of RAMS, including alternating supination and pronation of the forearm, hand opening and closing, finger taps, heel taps and toe taps. Gait and Station: The patient has no difficulty arising out of a deep-seated chair without the use of the hands. The patient's stride length is good.      Lab Results  Component Value Date   TSH 0.616 01/09/2018     Chemistry      Component Value Date/Time   NA 132 (L) 01/09/2018 1604   K 3.7 01/09/2018 1604   CL 96 01/09/2018 1604   CO2 19 (L) 01/09/2018 1604   BUN 18 01/09/2018 1604   CREATININE 1.09 (H) 01/09/2018 1604      Component Value Date/Time   CALCIUM 9.4 01/09/2018 1604   ALKPHOS 99 01/09/2018 1604   AST 12 01/09/2018 1604   ALT 15 01/09/2018 1604   BILITOT 0.3 01/09/2018 1604     No results found for:  VITAMINB12   ASSESSMENT/PLAN:  1.  Tremor  -pt does have 2 different types of tremor seen on examination today.  She has a rare, intermittent rest tremor on the right but no other features of parkinsons disease.  She does admit that Abilify greatly increased this type of tremor.  She was only on Abilify for about a month and states that tremor has decreased since discontinuation of Abilify.  I do want to see her back in about 9 months to 1 year just to make sure further symptoms do not develop.  She does also have a very mild essential tremor.  If that is bothersome, I would recommend increasing her propranolol to perhaps 60 mg twice per day.  She will discuss with us with her primary care provider.  She asked me about doing a CT of the brain, but at this point, I do not think that would be beneficial.  2.  Tobacco abuse  -Currently smoking 1 packs/day    - Patient was informed of the dangers of tobacco abuse including stroke, cancer, and MI, as well as benefits of tobacco cessation.  - Patient is willing to quit at this time and states that family is moving out of the home, which should help the stress level.  - Approximately 4 mins were spent counseling patient cessation techniques. We discussed various methods to help quit smoking, including deciding on a date to quit, joining a support group, pharmacological agents- nicotine gum/patch/lozenges, chantix,   - I will reassess her progress at future visit.    Cc:  Loletta SpecterGomez, Roger David, PA-C

## 2018-03-06 ENCOUNTER — Ambulatory Visit (INDEPENDENT_AMBULATORY_CARE_PROVIDER_SITE_OTHER): Payer: Self-pay | Admitting: Neurology

## 2018-03-06 ENCOUNTER — Encounter: Payer: Self-pay | Admitting: Neurology

## 2018-03-06 VITALS — BP 138/84 | HR 76 | Ht 61.0 in | Wt 181.0 lb

## 2018-03-06 DIAGNOSIS — R259 Unspecified abnormal involuntary movements: Secondary | ICD-10-CM

## 2018-03-06 DIAGNOSIS — G250 Essential tremor: Secondary | ICD-10-CM

## 2018-03-06 DIAGNOSIS — Z720 Tobacco use: Secondary | ICD-10-CM

## 2018-03-08 DIAGNOSIS — G20 Parkinson's disease: Secondary | ICD-10-CM

## 2018-03-08 HISTORY — DX: Parkinson's disease: G20

## 2018-03-10 ENCOUNTER — Encounter (INDEPENDENT_AMBULATORY_CARE_PROVIDER_SITE_OTHER): Payer: Self-pay | Admitting: Physician Assistant

## 2018-03-10 ENCOUNTER — Other Ambulatory Visit: Payer: Self-pay

## 2018-03-10 ENCOUNTER — Ambulatory Visit (INDEPENDENT_AMBULATORY_CARE_PROVIDER_SITE_OTHER): Payer: Self-pay | Admitting: Physician Assistant

## 2018-03-10 VITALS — BP 107/72 | HR 69 | Temp 97.8°F | Ht 61.0 in | Wt 176.6 lb

## 2018-03-10 DIAGNOSIS — G250 Essential tremor: Secondary | ICD-10-CM

## 2018-03-10 DIAGNOSIS — E119 Type 2 diabetes mellitus without complications: Secondary | ICD-10-CM

## 2018-03-10 MED ORDER — PROPRANOLOL HCL 60 MG PO TABS: 60.0000 mg | ORAL_TABLET | Freq: Two times a day (BID) | ORAL | 1 refills | Status: DC

## 2018-03-10 MED FILL — PROPRANOLOL 60 MG TABLET: 60 | 30 days supply | Qty: 60 | Fill #0

## 2018-03-10 NOTE — Progress Notes (Signed)
Subjective:  Patient ID: Tricia Ramsey, female    DOB: [birth year]  Age: 63 y.o. MRN: [id number]  CC: f/u tremor  HPI Tricia Ramsey a 63 y.o.femalewith a medical history of HTN, DM2, HLD, tinea pedis, depression, and chronic fatigue presents to f/u on tremor. Last seen here one month ago. Had previously been prescribed propranolol for tremor and tachycardia which reportedly made the tremor worse. She was then referred to neurology and advised to continue propranolol. Saw neurologist four days ago and diagnosed with essential tremor. Advised to increase propranolol to 60 mg if tremor is bothersome. Pt feels should would like to try the 60 mg dose. Does not endorse other neurological symptoms.     In regards to DM2. Pt reports lower blood sugars since the increase in Lantus and Januvia but still varies widely in her readings. Can range from 90s to low 400s. Attributes widely varying readings to her diet. She would like to go to diabetes and nutrition class. Does not endorse any polydipsia, polyuria, fatigue, visual blurring, tingling or numbness at this time.       Outpatient Medications Prior to Visit  Medication Sig Dispense Refill  . Insulin Glargine (LANTUS SOLOSTAR) 100 UNIT/ML Solostar Pen Inject 45 Units into the skin every morning. 5 pen 5  . lisinopril (PRINIVIL,ZESTRIL) 2.5 MG tablet Take 2 tablets (5 mg total) by mouth daily. 30 tablet 5  . lovastatin (MEVACOR) 40 MG tablet Take 0.5 tablets (20 mg total) by mouth at bedtime. 30 tablet 5  . metFORMIN (GLUCOPHAGE XR) 500 MG 24 hr tablet Take 2 tablets (1,000 mg total) by mouth daily with breakfast. 60 tablet 5  . PARoxetine (PAXIL) 40 MG tablet Take 1 tablet (40 mg total) by mouth every morning. 30 tablet 5  . propranolol (INDERAL) 40 MG tablet Take 1 tablet (40 mg total) by mouth 2 (two) times daily. 60 tablet 5  . sitaGLIPtin (JANUVIA) 100 MG tablet Take 1 tablet (100 mg total) by mouth daily. 30 tablet 5  . Insulin Pen Needle  (PEN NEEDLES) 31G X 5 MM MISC Inject 1 each into the skin at bedtime. 100 each 3   No facility-administered medications prior to visit.      ROS Review of Systems  Constitutional: Negative for chills, fever and malaise/fatigue.  Eyes: Negative for blurred vision.  Respiratory: Negative for shortness of breath.   Cardiovascular: Negative for chest pain and palpitations.  Gastrointestinal: Negative for abdominal pain and nausea.  Genitourinary: Negative for dysuria and hematuria.  Musculoskeletal: Negative for joint pain and myalgias.  Skin: Negative for rash.  Neurological: Negative for tingling and headaches.  Psychiatric/Behavioral: Negative for depression. The patient is not nervous/anxious.     Objective:  BP 107/72 (BP Location: Right Arm, Patient Position: Sitting, Cuff Size: Normal)   Pulse 69   Temp 97.8 F (36.6 C) (Oral)   Ht 5\' 1"  (1.549 m)   Wt 176 lb 9.6 oz (80.1 kg)   SpO2 96%   BMI 33.37 kg/m   BP/Weight 03/10/2018 03/06/2018 02/07/2018  Systolic BP 107 138 116  Diastolic BP 72 84 79  Wt. (Lbs) 176.6 181 177.6  BMI 33.37 34.2 33.56      Physical Exam Vitals signs reviewed.  Constitutional:      Comments: Well developed, well nourished, NAD, polite  HENT:     Head: Normocephalic and atraumatic.     Mouth/Throat:     Comments: No oral thrush Eyes:     General:  No scleral icterus. Neck:     Musculoskeletal: Normal range of motion and neck supple.     Thyroid: No thyromegaly.     Comments: No thyromegaly Cardiovascular:     Rate and Rhythm: Normal rate and regular rhythm.     Heart sounds: Normal heart sounds.     Comments: No LE edema bilaterally Pulmonary:     Effort: Pulmonary effort is normal.     Breath sounds: Normal breath sounds.  Abdominal:     General: Bowel sounds are normal.     Palpations: Abdomen is soft.     Tenderness: There is no abdominal tenderness.  Skin:    General: Skin is warm and dry.     Coloration: Skin is not pale.      Findings: No erythema or rash.  Neurological:     Mental Status: She is alert and oriented to person, place, and time.  Psychiatric:        Behavior: Behavior normal.        Thought Content: Thought content normal.      Assessment & Plan:    1. Type 2 diabetes mellitus without complication, without long-term current use of insulin (HCC) - Referral to Nutrition and Diabetes Services. Although glucometer readings have improved, readings continue to vary widely and is likely attributed to poor knowledge of nutrition or indiscretion. - Continue Januvia 100 mg, Metformin XR 1000 mg qday, and Lantus 45 units qam. - Return in two months for A1c  2. Essential Tremor - Increase Propranolol to 60 mg BID. I have strongly advised patient to use her BP monitor at home to measure the drop in BP and HR while taking her increased dose of Propranolol. She is to immediately stop if BP lower than 90/60 mmHg or if HR is <55 bpm. Pt fully understood and will comply with directions. She will also report any symptoms she may feel with increased dose of propranolol.    Meds ordered this encounter  Medications  . propranolol (INDERAL) 60 MG tablet    Sig: Take 1 tablet (60 mg total) by mouth 2 (two) times daily.    Dispense:  60 tablet    Refill:  1    Order Specific Question:   Supervising Provider    Answer:   Hoy Register [[id number]]    Follow-up: Return in about 4 weeks (around 04/07/2018) for nurse visit for BP.   Loletta Specter PA

## 2018-03-10 NOTE — Patient Instructions (Signed)
Diabetes Mellitus and Nutrition, Adult  When you have diabetes (diabetes mellitus), it is very important to have healthy eating habits because your blood sugar (glucose) levels are greatly affected by what you eat and drink. Eating healthy foods in the appropriate amounts, at about the same times every day, can help you:  · Control your blood glucose.  · Lower your risk of heart disease.  · Improve your blood pressure.  · Reach or maintain a healthy weight.  Every person with diabetes is different, and each person has different needs for a meal plan. Your health care provider may recommend that you work with a diet and nutrition specialist (dietitian) to make a meal plan that is best for you. Your meal plan may vary depending on factors such as:  · The calories you need.  · The medicines you take.  · Your weight.  · Your blood glucose, blood pressure, and cholesterol levels.  · Your activity level.  · Other health conditions you have, such as heart or kidney disease.  How do carbohydrates affect me?  Carbohydrates, also called carbs, affect your blood glucose level more than any other type of food. Eating carbs naturally raises the amount of glucose in your blood. Carb counting is a method for keeping track of how many carbs you eat. Counting carbs is important to keep your blood glucose at a healthy level, especially if you use insulin or take certain oral diabetes medicines.  It is important to know how many carbs you can safely have in each meal. This is different for every person. Your dietitian can help you calculate how many carbs you should have at each meal and for each snack.  Foods that contain carbs include:  · Bread, cereal, rice, pasta, and crackers.  · Potatoes and corn.  · Peas, beans, and lentils.  · Milk and yogurt.  · Fruit and juice.  · Desserts, such as cakes, cookies, ice cream, and candy.  How does alcohol affect me?  Alcohol can cause a sudden decrease in blood glucose (hypoglycemia),  especially if you use insulin or take certain oral diabetes medicines. Hypoglycemia can be a life-threatening condition. Symptoms of hypoglycemia (sleepiness, dizziness, and confusion) are similar to symptoms of having too much alcohol.  If your health care provider says that alcohol is safe for you, follow these guidelines:  · Limit alcohol intake to no more than 1 drink per day for nonpregnant women and 2 drinks per day for men. One drink equals 12 oz of beer, 5 oz of wine, or 1½ oz of hard liquor.  · Do not drink on an empty stomach.  · Keep yourself hydrated with water, diet soda, or unsweetened iced tea.  · Keep in mind that regular soda, juice, and other mixers may contain a lot of sugar and must be counted as carbs.  What are tips for following this plan?    Reading food labels  · Start by checking the serving size on the "Nutrition Facts" label of packaged foods and drinks. The amount of calories, carbs, fats, and other nutrients listed on the label is based on one serving of the item. Many items contain more than one serving per package.  · Check the total grams (g) of carbs in one serving. You can calculate the number of servings of carbs in one serving by dividing the total carbs by 15. For example, if a food has 30 g of total carbs, it would be equal to 2   servings of carbs.  · Check the number of grams (g) of saturated and trans fats in one serving. Choose foods that have low or no amount of these fats.  · Check the number of milligrams (mg) of salt (sodium) in one serving. Most people should limit total sodium intake to less than 2,300 mg per day.  · Always check the nutrition information of foods labeled as "low-fat" or "nonfat". These foods may be higher in added sugar or refined carbs and should be avoided.  · Talk to your dietitian to identify your daily goals for nutrients listed on the label.  Shopping  · Avoid buying canned, premade, or processed foods. These foods tend to be high in fat, sodium,  and added sugar.  · Shop around the outside edge of the grocery store. This includes fresh fruits and vegetables, bulk grains, fresh meats, and fresh dairy.  Cooking  · Use low-heat cooking methods, such as baking, instead of high-heat cooking methods like deep frying.  · Cook using healthy oils, such as olive, canola, or sunflower oil.  · Avoid cooking with butter, cream, or high-fat meats.  Meal planning  · Eat meals and snacks regularly, preferably at the same times every day. Avoid going long periods of time without eating.  · Eat foods high in fiber, such as fresh fruits, vegetables, beans, and whole grains. Talk to your dietitian about how many servings of carbs you can eat at each meal.  · Eat 4-6 ounces (oz) of lean protein each day, such as lean meat, chicken, fish, eggs, or tofu. One oz of lean protein is equal to:  ? 1 oz of meat, chicken, or fish.  ? 1 egg.  ? ¼ cup of tofu.  · Eat some foods each day that contain healthy fats, such as avocado, nuts, seeds, and fish.  Lifestyle  · Check your blood glucose regularly.  · Exercise regularly as told by your health care provider. This may include:  ? 150 minutes of moderate-intensity or vigorous-intensity exercise each week. This could be brisk walking, biking, or water aerobics.  ? Stretching and doing strength exercises, such as yoga or weightlifting, at least 2 times a week.  · Take medicines as told by your health care provider.  · Do not use any products that contain nicotine or tobacco, such as cigarettes and e-cigarettes. If you need help quitting, ask your health care provider.  · Work with a counselor or diabetes educator to identify strategies to manage stress and any emotional and social challenges.  Questions to ask a health care provider  · Do I need to meet with a diabetes educator?  · Do I need to meet with a dietitian?  · What number can I call if I have questions?  · When are the best times to check my blood glucose?  Where to find more  information:  · American Diabetes Association: diabetes.org  · Academy of Nutrition and Dietetics: www.eatright.org  · National Institute of Diabetes and Digestive and Kidney Diseases (NIH): www.niddk.nih.gov  Summary  · A healthy meal plan will help you control your blood glucose and maintain a healthy lifestyle.  · Working with a diet and nutrition specialist (dietitian) can help you make a meal plan that is best for you.  · Keep in mind that carbohydrates (carbs) and alcohol have immediate effects on your blood glucose levels. It is important to count carbs and to use alcohol carefully.  This information is not intended to   replace advice given to you by your health care provider. Make sure you discuss any questions you have with your health care provider.  Document Released: 11/19/2004 Document Revised: 09/22/2016 Document Reviewed: 03/29/2016  [employer]vier Interactive Patient Education © 2019 [employer]

## 2018-03-13 MED FILL — LOVASTATIN 40 MG TABS: 40 | 30 days supply | Qty: 15 | Fill #2

## 2018-03-13 MED FILL — METFORMIN HCL ER 500 MG TAB: 500 | 30 days supply | Qty: 60 | Fill #2

## 2018-03-22 MED FILL — !JANUVIA 100MG TABLET: 100 | 30 days supply | Qty: 30 | Fill #1

## 2018-03-22 MED FILL — LISINOPRIL 2.5 MG TABLET: 2.5 | 15 days supply | Qty: 30 | Fill #2

## 2018-03-24 MED FILL — !LANTUS SOLOSTAR 100UNITS/M: 100 | 25 days supply | Qty: 12 | Fill #2

## 2018-04-04 ENCOUNTER — Ambulatory Visit (INDEPENDENT_AMBULATORY_CARE_PROVIDER_SITE_OTHER): Payer: Self-pay

## 2018-04-04 ENCOUNTER — Encounter (INDEPENDENT_AMBULATORY_CARE_PROVIDER_SITE_OTHER): Payer: Self-pay

## 2018-04-04 VITALS — BP 108/72 | HR 65 | Temp 98.0°F | Ht 61.0 in

## 2018-04-04 DIAGNOSIS — Z0130 Encounter for examination of blood pressure without abnormal findings: Secondary | ICD-10-CM

## 2018-04-05 ENCOUNTER — Ambulatory Visit (INDEPENDENT_AMBULATORY_CARE_PROVIDER_SITE_OTHER): Payer: Self-pay

## 2018-04-07 ENCOUNTER — Ambulatory Visit (INDEPENDENT_AMBULATORY_CARE_PROVIDER_SITE_OTHER): Payer: Self-pay

## 2018-04-12 MED FILL — PARoxetine HCL 40 MG TABS: 40 | 30 days supply | Qty: 30 | Fill #2

## 2018-04-12 MED FILL — PROPRANOLOL 60 MG TABLET: 60 | 30 days supply | Qty: 60 | Fill #1

## 2018-05-02 ENCOUNTER — Encounter (INDEPENDENT_AMBULATORY_CARE_PROVIDER_SITE_OTHER): Payer: Self-pay | Admitting: Primary Care

## 2018-05-02 ENCOUNTER — Telehealth (INDEPENDENT_AMBULATORY_CARE_PROVIDER_SITE_OTHER): Payer: Self-pay | Admitting: Physician Assistant

## 2018-05-02 ENCOUNTER — Other Ambulatory Visit: Payer: Self-pay

## 2018-05-02 ENCOUNTER — Ambulatory Visit (INDEPENDENT_AMBULATORY_CARE_PROVIDER_SITE_OTHER): Payer: Self-pay | Admitting: Primary Care

## 2018-05-02 VITALS — BP 128/88 | HR 82 | Temp 97.7°F | Ht 61.0 in | Wt 184.6 lb

## 2018-05-02 DIAGNOSIS — Z23 Encounter for immunization: Secondary | ICD-10-CM

## 2018-05-02 DIAGNOSIS — G250 Essential tremor: Secondary | ICD-10-CM

## 2018-05-02 DIAGNOSIS — E119 Type 2 diabetes mellitus without complications: Secondary | ICD-10-CM

## 2018-05-02 DIAGNOSIS — Z1211 Encounter for screening for malignant neoplasm of colon: Secondary | ICD-10-CM

## 2018-05-02 DIAGNOSIS — F418 Other specified anxiety disorders: Secondary | ICD-10-CM

## 2018-05-02 LAB — GLUCOSE, POCT (MANUAL RESULT ENTRY): POC Glucose: 186 mg/dl — AB (ref 70–99)

## 2018-05-02 LAB — POCT GLYCOSYLATED HEMOGLOBIN (HGB A1C): Hemoglobin A1C: 10 % — AB (ref 4.0–5.6)

## 2018-05-02 MED ORDER — LISINOPRIL 2.5 MG PO TABS: 5.0000 mg | ORAL_TABLET | Freq: Every day | ORAL | 5 refills | Status: DC

## 2018-05-02 MED ORDER — METFORMIN HCL ER 500 MG PO TB24: 1000.0000 mg | ORAL_TABLET | Freq: Every day | ORAL | 5 refills | Status: DC

## 2018-05-02 MED ORDER — SITAGLIPTIN PHOSPHATE 100 MG PO TABS: 100.0000 mg | ORAL_TABLET | Freq: Every day | ORAL | 5 refills | Status: DC

## 2018-05-02 MED ORDER — GLIMEPIRIDE 4 MG PO TABS: 4.0000 mg | ORAL_TABLET | Freq: Every day | ORAL | 3 refills | Status: DC

## 2018-05-02 MED ORDER — LOVASTATIN 40 MG PO TABS: 20.0000 mg | ORAL_TABLET | Freq: Every day | ORAL | 5 refills | Status: DC

## 2018-05-02 MED ORDER — PAROXETINE HCL 40 MG PO TABS: 40.0000 mg | ORAL_TABLET | ORAL | 5 refills | Status: DC

## 2018-05-02 MED ORDER — PROPRANOLOL HCL 60 MG PO TABS: 60.0000 mg | ORAL_TABLET | Freq: Two times a day (BID) | ORAL | 1 refills | Status: DC

## 2018-05-02 MED FILL — LOVASTATIN 40 MG TABS: 40 | 30 days supply | Qty: 15 | Fill #0

## 2018-05-02 MED FILL — GLIMEPIRIDE 4 MG TABS: 4 | 30 days supply | Qty: 30 | Fill #0

## 2018-05-02 MED FILL — METFORMIN HCL ER 500 MG TAB: 500 | 30 days supply | Qty: 60 | Fill #0

## 2018-05-02 MED FILL — !JANUVIA 100MG TABLET: 100 | 30 days supply | Qty: 30 | Fill #2

## 2018-05-02 MED FILL — LISINOPRIL 2.5 MG TABLET: 2.5 | 15 days supply | Qty: 30 | Fill #0

## 2018-05-02 NOTE — Progress Notes (Signed)
Established Patient Office Visit  Subjective:  Patient ID: Tricia Ramsey, female    DOB: 1956/01/23  Age: 63 y.o. MRN: [id number]  CC:  Chief Complaint  Patient presents with  . Follow-up    DM    HPI [name] presents for follow up on diabetes 11/19 A1C was 12.6 today reading is 10. Patient has stopped taking insulin and started back taking her Amaryl which she feels works better. Past medical history of HTN, DM2, HLD, tinea pedis, depression, chronic fatigue, and tremors . Followed by Kerin Salen DO for essential tremors.       Past Medical History:  Diagnosis Date  . Anxiety   . Diabetes mellitus     Past Surgical History:  Procedure Laterality Date  . ABDOMINAL HYSTERECTOMY    . CHOLECYSTECTOMY    . SHOULDER ARTHROSCOPY W/ ROTATOR CUFF REPAIR      Family History  Problem Relation Age of Onset  . Alzheimer's disease Mother   . Diabetes Mother   . Other Father        unknown  . Cancer Sister   . Other Brother        unknown    Social History   Socioeconomic History  . Marital status: Married    Spouse name: Not on file  . Number of children: Not on file  . Years of education: Not on file  . Highest education level: Not on file  Occupational History  . Not on file  Social Needs  . Financial resource strain: Not on file  . Food insecurity:    Worry: Not on file    Inability: Not on file  . Transportation needs:    Medical: Not on file    Non-medical: Not on file  Tobacco Use  . Smoking status: Current Every Day Smoker    Packs/day: 1.00    Types: Cigarettes  . Smokeless tobacco: Never Used  Substance and Sexual Activity  . Alcohol use: No  . Drug use: No  . Sexual activity: Not on file  Lifestyle  . Physical activity:    Days per week: Not on file    Minutes per session: Not on file  . Stress: Not on file  Relationships  . Social connections:    Talks on phone: Not on file    Gets together: Not on file    Attends religious service:  Not on file    Active member of club or organization: Not on file    Attends meetings of clubs or organizations: Not on file    Relationship status: Not on file  . Intimate partner violence:    Fear of current or ex partner: Not on file    Emotionally abused: Not on file    Physically abused: Not on file    Forced sexual activity: Not on file  Other Topics Concern  . Not on file  Social History Narrative  . Not on file    Outpatient Medications Prior to Visit  Medication Sig Dispense Refill  . Insulin Pen Needle (PEN NEEDLES) 31G X 5 MM MISC Inject 1 each into the skin at bedtime. 100 each 3  . lisinopril (PRINIVIL,ZESTRIL) 2.5 MG tablet Take 2 tablets (5 mg total) by mouth daily. 30 tablet 5  . lovastatin (MEVACOR) 40 MG tablet Take 0.5 tablets (20 mg total) by mouth at bedtime. 30 tablet 5  . metFORMIN (GLUCOPHAGE XR) 500 MG 24 hr tablet Take 2 tablets (1,000 mg total)  by mouth daily with breakfast. 60 tablet 5  . PARoxetine (PAXIL) 40 MG tablet Take 1 tablet (40 mg total) by mouth every morning. 30 tablet 5  . propranolol (INDERAL) 60 MG tablet Take 1 tablet (60 mg total) by mouth 2 (two) times daily. 60 tablet 1  . sitaGLIPtin (JANUVIA) 100 MG tablet Take 1 tablet (100 mg total) by mouth daily. 30 tablet 5  . Insulin Glargine (LANTUS SOLOSTAR) 100 UNIT/ML Solostar Pen Inject 45 Units into the skin every morning. (Patient not taking: Reported on 05/02/2018) 5 pen 5   No facility-administered medications prior to visit.     No Known Allergies  ROS Review of Systems  Constitutional: Negative.   HENT: Negative.   Eyes: Negative.   Respiratory: Negative.   Cardiovascular: Negative.   Gastrointestinal: Positive for abdominal distention.  Endocrine: Negative.   Genitourinary: Negative.   Musculoskeletal: Negative.   Skin: Negative.   Allergic/Immunologic: Negative.   Neurological: Negative.   Hematological: Negative.   Psychiatric/Behavioral: Negative.       Objective:     Physical Exam  Constitutional: She is oriented to person, place, and time. She appears well-developed and well-nourished.  HENT:  Head: Normocephalic.  Eyes: Pupils are equal, round, and reactive to light. EOM are normal.  Neck: Normal range of motion. Neck supple.  Cardiovascular: Normal rate and regular rhythm.  Pulmonary/Chest: Effort normal and breath sounds normal.  Abdominal: Soft. Bowel sounds are normal.  Musculoskeletal: Normal range of motion.  Neurological: She is alert and oriented to person, place, and time.  Skin: Skin is warm and dry.  Psychiatric: She has a normal mood and affect.    BP 128/88 (BP Location: Right Arm, Patient Position: Sitting, Cuff Size: Normal)   Pulse 82   Temp 97.7 F (36.5 C) (Oral)   Ht 5\' 1"  (1.549 m)   Wt 184 lb 9.6 oz (83.7 kg)   SpO2 95%   BMI 34.88 kg/m  Wt Readings from Last 3 Encounters:  05/02/18 184 lb 9.6 oz (83.7 kg)  03/10/18 176 lb 9.6 oz (80.1 kg)  03/06/18 181 lb (82.1 kg)     Health Maintenance Due  Topic Date Due  . Hepatitis C Screening  03-29-1955  . HIV Screening  09/04/1972  . TETANUS/TDAP  09/04/1976  . PAP SMEAR-Modifier  09/05/1978  . MAMMOGRAM  09/05/2007  . COLONOSCOPY  09/05/2007    There are no preventive care reminders to display for this patient.  Lab Results  Component Value Date   TSH 0.616 01/09/2018   Lab Results  Component Value Date   WBC 8.3 06/15/2017   HGB 15.3 (H) 06/15/2017   HCT 44.4 06/15/2017   MCV 90.1 06/15/2017   PLT 189 06/15/2017   Lab Results  Component Value Date   NA 132 (L) 01/09/2018   K 3.7 01/09/2018   CO2 19 (L) 01/09/2018   GLUCOSE 519 (HH) 01/09/2018   BUN 18 01/09/2018   CREATININE 1.09 (H) 01/09/2018   BILITOT 0.3 01/09/2018   ALKPHOS 99 01/09/2018   AST 12 01/09/2018   ALT 15 01/09/2018   PROT 7.4 01/09/2018   ALBUMIN 4.4 01/09/2018   CALCIUM 9.4 01/09/2018   ANIONGAP 12 06/15/2017   Lab Results  Component Value Date   CHOL 155 01/18/2018    Lab Results  Component Value Date   HDL 54 01/18/2018   Lab Results  Component Value Date   LDLCALC 79 01/18/2018   Lab Results  Component Value Date  TRIG 109 01/18/2018   Lab Results  Component Value Date   CHOLHDL 2.9 01/18/2018   Lab Results  Component Value Date   HGBA1C 10.0 (A) 05/02/2018      Assessment & Plan:   Visit Diagnoses    Type 2 diabetes mellitus without complication, without long-term current use of insulin (HCC)    -  Primary added novolog SS A1C from 12 to 10   Relevant Medications   lisinopril (PRINIVIL,ZESTRIL) 2.5 MG tablet   lovastatin (MEVACOR) 40 MG tablet   metFORMIN (GLUCOPHAGE XR) 500 MG 24 hr tablet   sitaGLIPtin (JANUVIA) 100 MG tablet   glimepiride (AMARYL) 4 MG tablet   Other Relevant Orders   HgB A1c (Completed)   Glucose (CBG) (Completed)   Special screening for malignant neoplasms, colon       Relevant Orders   Fecal occult blood, imunochemical([facility])   Need for Tdap vaccination       Relevant Orders   Tdap vaccine greater than or equal to 7yo IM (Completed)   Depression with anxiety       No s/s of suicidal ideations    PARoxetine (PAXIL) 40 MG tablet   Essential tremor       Relevant Medications   propranolol (INDERAL) 60 MG tablet      Meds ordered this encounter  Medications  . lisinopril (PRINIVIL,ZESTRIL) 2.5 MG tablet    Sig: Take 2 tablets (5 mg total) by mouth daily.    Dispense:  30 tablet    Refill:  5  . lovastatin (MEVACOR) 40 MG tablet    Sig: Take 0.5 tablets (20 mg total) by mouth at bedtime.    Dispense:  30 tablet    Refill:  5  . metFORMIN (GLUCOPHAGE XR) 500 MG 24 hr tablet    Sig: Take 2 tablets (1,000 mg total) by mouth daily with breakfast.    Dispense:  60 tablet    Refill:  5  . PARoxetine (PAXIL) 40 MG tablet    Sig: Take 1 tablet (40 mg total) by mouth every morning.    Dispense:  30 tablet    Refill:  5  . propranolol (INDERAL) 60 MG tablet    Sig: Take 1 tablet (60 mg  total) by mouth 2 (two) times daily.    Dispense:  60 tablet    Refill:  1  . sitaGLIPtin (JANUVIA) 100 MG tablet    Sig: Take 1 tablet (100 mg total) by mouth daily.    Dispense:  30 tablet    Refill:  5  . glimepiride (AMARYL) 4 MG tablet    Sig: Take 1 tablet (4 mg total) by mouth daily before breakfast.    Dispense:  30 tablet    Refill:  3    Follow-up: Return in about 3 months (around 07/31/2018) for diabetes.    Grayce SessionsMichelle P , NP

## 2018-05-02 NOTE — Telephone Encounter (Signed)
Patient called to inform PCP about her insulin.  Novolin N 30 units  Novolin R Scale  Please advise   Thank you Tricia Ramsey

## 2018-05-02 NOTE — Patient Instructions (Signed)
Diabetes Basics    Diabetes (diabetes mellitus) is a long-term (chronic) disease. It occurs when the body does not properly use sugar (glucose) that is released from food after you eat.  Diabetes may be caused by one or both of these problems:  · Your pancreas does not make enough of a hormone called insulin.  · Your body does not react in a normal way to insulin that it makes.  Insulin lets sugars (glucose) go into cells in your body. This gives you energy. If you have diabetes, sugars cannot get into cells. This causes high blood sugar (hyperglycemia).  Follow these instructions at home:  How is diabetes treated?  You may need to take insulin or other diabetes medicines daily to keep your blood sugar in balance. Take your diabetes medicines every day as told by your doctor. List your diabetes medicines here:  Diabetes medicines  · Name of medicine: ______________________________  ? Amount (dose): _______________ Time (a.m./p.m.): _______________ Notes: ___________________________________  · Name of medicine: ______________________________  ? Amount (dose): _______________ Time (a.m./p.m.): _______________ Notes: ___________________________________  · Name of medicine: ______________________________  ? Amount (dose): _______________ Time (a.m./p.m.): _______________ Notes: ___________________________________  If you use insulin, you will learn how to give yourself insulin by injection. You may need to adjust the amount based on the food that you eat. List the types of insulin you use here:  Insulin  · Insulin type: ______________________________  ? Amount (dose): _______________ Time (a.m./p.m.): _______________ Notes: ___________________________________  · Insulin type: ______________________________  ? Amount (dose): _______________ Time (a.m./p.m.): _______________ Notes: ___________________________________  · Insulin type: ______________________________  ? Amount (dose): _______________ Time (a.m./p.m.):  _______________ Notes: ___________________________________  · Insulin type: ______________________________  ? Amount (dose): _______________ Time (a.m./p.m.): _______________ Notes: ___________________________________  · Insulin type: ______________________________  ? Amount (dose): _______________ Time (a.m./p.m.): _______________ Notes: ___________________________________  How do I manage my blood sugar?    Check your blood sugar levels using a blood glucose monitor as directed by your doctor.  Your doctor will set treatment goals for you. Generally, you should have these blood sugar levels:  · Before meals (preprandial): 80-130 mg/dL (4.4-7.2 mmol/L).  · After meals (postprandial): below 180 mg/dL (10 mmol/L).  · A1c level: less than 7%.  Write down the times that you will check your blood sugar levels:  Blood sugar checks  · Time: _______________ Notes: ___________________________________  · Time: _______________ Notes: ___________________________________  · Time: _______________ Notes: ___________________________________  · Time: _______________ Notes: ___________________________________  · Time: _______________ Notes: ___________________________________  · Time: _______________ Notes: ___________________________________    What do I need to know about low blood sugar?  Low blood sugar is called hypoglycemia. This is when blood sugar is at or below 70 mg/dL (3.9 mmol/L). Symptoms may include:  · Feeling:  ? Hungry.  ? Worried or nervous (anxious).  ? Sweaty and clammy.  ? Confused.  ? Dizzy.  ? Sleepy.  ? Sick to your stomach (nauseous).  · Having:  ? A fast heartbeat.  ? A headache.  ? A change in your vision.  ? Tingling or no feeling (numbness) around the mouth, lips, or tongue.  ? Jerky movements that you cannot control (seizure).  · Having trouble with:  ? Moving (coordination).  ? Sleeping.  ? Passing out (fainting).  ? Getting upset easily (irritability).  Treating low blood sugar  To treat low blood  sugar, eat or drink something sugary right away. If you can think clearly and swallow safely, follow the 15:15   rule:  · Take 15 grams of a fast-acting carb (carbohydrate). Talk with your doctor about how much you should take.  · Some fast-acting carbs are:  ? Sugar tablets (glucose pills). Take 3-4 glucose pills.  ? 6-8 pieces of hard candy.  ? 4-6 oz (120-150 mL) of fruit juice.  ? 4-6 oz (120-150 mL) of regular (not diet) soda.  ? 1 Tbsp (15 mL) honey or sugar.  · Check your blood sugar 15 minutes after you take the carb.  · If your blood sugar is still at or below 70 mg/dL (3.9 mmol/L), take 15 grams of a carb again.  · If your blood sugar does not go above 70 mg/dL (3.9 mmol/L) after 3 tries, get help right away.  · After your blood sugar goes back to normal, eat a meal or a snack within 1 hour.  Treating very low blood sugar  If your blood sugar is at or below 54 mg/dL (3 mmol/L), you have very low blood sugar (severe hypoglycemia). This is an emergency. Do not wait to see if the symptoms will go away. Get medical help right away. Call your local emergency services (911 in the [place] Do not drive yourself to the hospital.  Questions to ask your health care provider  · Do I need to meet with a diabetes educator?  · What equipment will I need to care for myself at home?  · What diabetes medicines do I need? When should I take them?  · How often do I need to check my blood sugar?  · What number can I call if I have questions?  · When is my next doctor's visit?  · Where can I find a support group for people with diabetes?  Where to find more information  · American Diabetes Association: www.diabetes.org  · American Association of Diabetes Educators: www.diabeteseducator.org/patient-resources  Contact a doctor if:  · Your blood sugar is at or above 240 mg/dL (13.3 mmol/L) for 2 days in a row.  · You have been sick or have had a fever for 2 days or more, and you are not getting better.  · You have any of these  problems for more than 6 hours:  ? You cannot eat or drink.  ? You feel sick to your stomach (nauseous).  ? You throw up (vomit).  ? You have watery poop (diarrhea).  Get help right away if:  · Your blood sugar is lower than 54 mg/dL (3 mmol/L).  · You get confused.  · You have trouble:  ? Thinking clearly.  ? Breathing.  Summary  · Diabetes (diabetes mellitus) is a long-term (chronic) disease. It occurs when the body does not properly use sugar (glucose) that is released from food after digestion.  · Take insulin and diabetes medicines as told.  · Check your blood sugar every day, as often as told.  · Keep all follow-up visits as told by your doctor. This is important.  This information is not intended to replace advice given to you by your health care provider. Make sure you discuss any questions you have with your health care provider.  Document Released: 05/27/2017 Document Revised: 08/15/2017 Document Reviewed: 05/27/2017  Elsevier Interactive Patient Education © 2019 [employer]

## 2018-05-02 NOTE — Telephone Encounter (Signed)
FWD to PCP. Mayo Faulk S Mario Voong, CMA  

## 2018-05-02 NOTE — Telephone Encounter (Signed)
Call pt to tell do not take any insulin except novolog R Sliding scale 200 -250 take 4 units 251-300 take 6 units 301-350 take 8 units 351-400 take 10 units . 400> call provider

## 2018-05-03 LAB — FECAL OCCULT BLOOD, IMMUNOCHEMICAL: Fecal Occult Bld: NEGATIVE

## 2018-05-03 NOTE — Telephone Encounter (Signed)
Patient is aware that she is to only take Novolog R insulin on sliding scale. Patient provided sliding scale instructions. She confirmed understanding by verbal read back of instructions. Maryjean Morn, CMA

## 2018-05-19 MED FILL — LISINOPRIL 2.5 MG TABLET: 2.5 | 15 days supply | Qty: 30 | Fill #1

## 2018-05-19 MED FILL — PARoxetine HCL 40 MG TABS: 40 | 30 days supply | Qty: 30 | Fill #3

## 2018-05-19 MED FILL — PROPRANOLOL 60 MG TABLET: 60 | 30 days supply | Qty: 60 | Fill #0

## 2018-06-01 MED FILL — LISINOPRIL 2.5 MG TABLET: 2.5 | 30 days supply | Qty: 60 | Fill #2

## 2018-06-01 MED FILL — LOVASTATIN 40 MG TABS: 40 | 30 days supply | Qty: 15 | Fill #1

## 2018-06-01 MED FILL — GLIMEPIRIDE 4 MG TABS: 4 | 30 days supply | Qty: 30 | Fill #1

## 2018-06-01 MED FILL — $JANUVIA 100 MG TABLET: 100 | 90 days supply | Qty: 90 | Fill #3

## 2018-06-15 MED FILL — METFORMIN HCL ER 500 MG TAB: 500 | 30 days supply | Qty: 60 | Fill #1

## 2018-06-29 MED FILL — GLIMEPIRIDE 4 MG TABS: 4 | 30 days supply | Qty: 30 | Fill #2

## 2018-06-29 MED FILL — PROPRANOLOL 60 MG TABLET: 60 | 30 days supply | Qty: 60 | Fill #1

## 2018-07-03 MED FILL — PARoxetine HCL 40 MG TABS: 40 | 30 days supply | Qty: 30 | Fill #4

## 2018-07-25 MED FILL — LISINOPRIL 2.5 MG TABLET: 2.5 | 30 days supply | Qty: 60 | Fill #3

## 2018-07-25 MED FILL — LOVASTATIN 40 MG TABS: 40 | 30 days supply | Qty: 15 | Fill #2

## 2018-07-25 MED FILL — METFORMIN HCL ER 500 MG TAB: 500 | 30 days supply | Qty: 60 | Fill #2

## 2018-08-01 ENCOUNTER — Ambulatory Visit: Payer: Self-pay | Admitting: Primary Care

## 2018-08-07 MED FILL — ?PAROXETINE HCL 40 MG TABS: 40 | 30 days supply | Qty: 30 | Fill #5

## 2018-08-07 MED FILL — GLIMEPIRIDE 4 MG TABS: 4 | 30 days supply | Qty: 30 | Fill #3

## 2018-08-08 ENCOUNTER — Other Ambulatory Visit: Payer: Self-pay | Admitting: Pharmacist

## 2018-08-08 DIAGNOSIS — G250 Essential tremor: Secondary | ICD-10-CM

## 2018-08-08 MED ORDER — PROPRANOLOL HCL 60 MG PO TABS: 60.0000 mg | ORAL_TABLET | Freq: Two times a day (BID) | ORAL | 2 refills | Status: DC

## 2018-08-08 MED FILL — PROPRANOLOL 60 MG TABLET: 60 | 30 days supply | Qty: 60 | Fill #0

## 2018-08-15 ENCOUNTER — Ambulatory Visit: Payer: Self-pay | Admitting: Primary Care

## 2018-08-31 MED FILL — METFORMIN HCL ER 500 MG TB2: 500 | 30 days supply | Qty: 60 | Fill #3

## 2018-08-31 MED FILL — PROPRANOLOL 60 MG TABLET: 60 | 30 days supply | Qty: 60 | Fill #1

## 2018-08-31 MED FILL — $JANUVIA 100 MG TABLET: 100 | 30 days supply | Qty: 30 | Fill #0

## 2018-08-31 MED FILL — ?LOVASTATIN 40MG TABS: 40 | 30 days supply | Qty: 15 | Fill #3

## 2018-08-31 MED FILL — ?PAROXETINE HCL 40 MG TABS: 40 | 30 days supply | Qty: 30 | Fill #0

## 2018-09-05 ENCOUNTER — Other Ambulatory Visit: Payer: Self-pay | Admitting: Pharmacist

## 2018-09-05 MED ORDER — GLIMEPIRIDE 4 MG PO TABS: 4.0000 mg | ORAL_TABLET | Freq: Every day | ORAL | 0 refills | Status: DC

## 2018-09-05 MED FILL — GLIMEPIRIDE 4 MG TABS: 4 | 30 days supply | Qty: 30 | Fill #0

## 2018-09-11 MED FILL — ?ARIPRAZOLE 5 MG TABS: 5 | 30 days supply | Qty: 30 | Fill #1

## 2018-09-19 ENCOUNTER — Encounter (INDEPENDENT_AMBULATORY_CARE_PROVIDER_SITE_OTHER): Payer: Self-pay | Admitting: Primary Care

## 2018-09-19 ENCOUNTER — Ambulatory Visit (INDEPENDENT_AMBULATORY_CARE_PROVIDER_SITE_OTHER): Payer: Self-pay | Admitting: Primary Care

## 2018-09-19 ENCOUNTER — Other Ambulatory Visit: Payer: Self-pay

## 2018-09-19 VITALS — BP 118/83 | HR 71 | Temp 98.0°F | Ht 61.0 in | Wt 184.0 lb

## 2018-09-19 DIAGNOSIS — Z1159 Encounter for screening for other viral diseases: Secondary | ICD-10-CM

## 2018-09-19 DIAGNOSIS — Z1239 Encounter for other screening for malignant neoplasm of breast: Secondary | ICD-10-CM

## 2018-09-19 DIAGNOSIS — E119 Type 2 diabetes mellitus without complications: Secondary | ICD-10-CM

## 2018-09-19 DIAGNOSIS — R739 Hyperglycemia, unspecified: Secondary | ICD-10-CM

## 2018-09-19 DIAGNOSIS — G250 Essential tremor: Secondary | ICD-10-CM

## 2018-09-19 DIAGNOSIS — F418 Other specified anxiety disorders: Secondary | ICD-10-CM

## 2018-09-19 DIAGNOSIS — Z720 Tobacco use: Secondary | ICD-10-CM

## 2018-09-19 DIAGNOSIS — Z114 Encounter for screening for human immunodeficiency virus [HIV]: Secondary | ICD-10-CM

## 2018-09-19 DIAGNOSIS — A938 Other specified arthropod-borne viral fevers: Secondary | ICD-10-CM

## 2018-09-19 LAB — POCT GLYCOSYLATED HEMOGLOBIN (HGB A1C): Hemoglobin A1C: 10.9 % — AB (ref 4.0–5.6)

## 2018-09-19 LAB — GLUCOSE, POCT (MANUAL RESULT ENTRY)
POC Glucose: 297 mg/dl — AB (ref 70–99)
POC Glucose: 243 mg/dl — AB (ref 70–99)

## 2018-09-19 MED ORDER — PROPRANOLOL HCL 60 MG PO TABS: 60.0000 mg | ORAL_TABLET | Freq: Two times a day (BID) | ORAL | 3 refills | Status: DC

## 2018-09-19 MED ORDER — LOVASTATIN 40 MG PO TABS: 20.0000 mg | ORAL_TABLET | Freq: Every day | ORAL | 3 refills | Status: DC

## 2018-09-19 MED ORDER — METFORMIN HCL ER 500 MG PO TB24: 1000.0000 mg | ORAL_TABLET | Freq: Every day | ORAL | 3 refills | Status: DC

## 2018-09-19 MED ORDER — PAROXETINE HCL 40 MG PO TABS: 40.0000 mg | ORAL_TABLET | ORAL | 3 refills | Status: DC

## 2018-09-19 MED ORDER — INSULIN ASPART 100 UNIT/ML ~~LOC~~ SOLN
10.0000 [IU] | Freq: Once | SUBCUTANEOUS | Status: AC
  Administered 2018-09-19: 10 [IU] via SUBCUTANEOUS

## 2018-09-19 MED ORDER — SITAGLIPTIN PHOSPHATE 100 MG PO TABS: 100.0000 mg | ORAL_TABLET | Freq: Every day | ORAL | 3 refills | Status: DC

## 2018-09-19 MED ORDER — LANTUS SOLOSTAR 100 UNIT/ML ~~LOC~~ SOPN: 25.0000 [IU] | PEN_INJECTOR | Freq: Two times a day (BID) | SUBCUTANEOUS | 5 refills | Status: DC

## 2018-09-19 MED ORDER — LISINOPRIL 2.5 MG PO TABS: 5.0000 mg | ORAL_TABLET | Freq: Every day | ORAL | 3 refills | Status: DC

## 2018-09-19 MED ORDER — GLIMEPIRIDE 4 MG PO TABS: 4.0000 mg | ORAL_TABLET | Freq: Every day | ORAL | 3 refills | Status: DC

## 2018-09-19 MED FILL — $LANTUS SOLOSTAR 100 UNITS/: 100 | 26 days supply | Qty: 12 | Fill #3

## 2018-09-19 MED FILL — LISINOPRIL 2.5 MG TABLET: 2.5 | 15 days supply | Qty: 30 | Fill #0

## 2018-09-19 NOTE — Patient Instructions (Signed)

## 2018-09-19 NOTE — Addendum Note (Signed)
Addended by: Nat Christen on: 09/19/2018 03:35 PM   Modules accepted: Orders

## 2018-09-19 NOTE — Progress Notes (Signed)
Established Patient Office Visit  Subjective:  Patient ID: Tricia Ramsey, female    DOB: 1956-02-20  Age: 63 y.o. MRN: [id number]  CC:  Chief Complaint  Patient presents with  . Follow-up    diabetes     HPI Tricia Ramsey presents for follow up and management of her diabetes and she has a concern with top of left foot hurts. She denies shortness of breath, headaches or chest pain. She does have lower extremity edema.   Past Medical History:  Diagnosis Date  . Anxiety   . Diabetes mellitus     Past Surgical History:  Procedure Laterality Date  . ABDOMINAL HYSTERECTOMY    . CHOLECYSTECTOMY    . SHOULDER ARTHROSCOPY W/ ROTATOR CUFF REPAIR      Family History  Problem Relation Age of Onset  . Alzheimer's disease Mother   . Diabetes Mother   . Other Father        unknown  . Cancer Sister   . Other Brother        unknown    Social History   Socioeconomic History  . Marital status: Married    Spouse name: Not on file  . Number of children: Not on file  . Years of education: Not on file  . Highest education level: Not on file  Occupational History  . Not on file  Social Needs  . Financial resource strain: Not on file  . Food insecurity    Worry: Not on file    Inability: Not on file  . Transportation needs    Medical: Not on file    Non-medical: Not on file  Tobacco Use  . Smoking status: Current Every Day Smoker    Packs/day: 1.00    Types: Cigarettes  . Smokeless tobacco: Never Used  Substance and Sexual Activity  . Alcohol use: No  . Drug use: No  . Sexual activity: Not on file  Lifestyle  . Physical activity    Days per week: Not on file    Minutes per session: Not on file  . Stress: Not on file  Relationships  . Social Herbalist on phone: Not on file    Gets together: Not on file    Attends religious service: Not on file    Active member of club or organization: Not on file    Attends meetings of clubs or organizations: Not on  file    Relationship status: Not on file  . Intimate partner violence    Fear of current or ex partner: Not on file    Emotionally abused: Not on file    Physically abused: Not on file    Forced sexual activity: Not on file  Other Topics Concern  . Not on file  Social History Narrative  . Not on file    Outpatient Medications Prior to Visit  Medication Sig Dispense Refill  . glimepiride (AMARYL) 4 MG tablet Take 1 tablet (4 mg total) by mouth daily before breakfast. Needs office visit for more refills. 30 tablet 0  . Insulin Pen Needle (PEN NEEDLES) 31G X 5 MM MISC Inject 1 each into the skin at bedtime. 100 each 3  . lisinopril (PRINIVIL,ZESTRIL) 2.5 MG tablet Take 2 tablets (5 mg total) by mouth daily. 30 tablet 5  . lovastatin (MEVACOR) 40 MG tablet Take 0.5 tablets (20 mg total) by mouth at bedtime. 30 tablet 5  . metFORMIN (GLUCOPHAGE XR) 500 MG 24 hr tablet  Take 2 tablets (1,000 mg total) by mouth daily with breakfast. 60 tablet 5  . PARoxetine (PAXIL) 40 MG tablet Take 1 tablet (40 mg total) by mouth every morning. 30 tablet 5  . propranolol (INDERAL) 60 MG tablet Take 1 tablet (60 mg total) by mouth 2 (two) times daily. 60 tablet 2  . sitaGLIPtin (JANUVIA) 100 MG tablet Take 1 tablet (100 mg total) by mouth daily. 30 tablet 5  . Insulin Glargine (LANTUS SOLOSTAR) 100 UNIT/ML Solostar Pen Inject 45 Units into the skin every morning. (Patient not taking: Reported on 05/02/2018) 5 pen 5   No facility-administered medications prior to visit.     No Known Allergies  ROS Review of Systems  Musculoskeletal:       Foot pain left on the top      Objective:    Physical Exam  Constitutional: She is oriented to person, place, and time. She appears well-developed and well-nourished.  HENT:  Head: Normocephalic and atraumatic.  Eyes: EOM are normal.  Neck: Normal range of motion. Neck supple.  Pulmonary/Chest: Effort normal and breath sounds normal.  Abdominal: Soft. Bowel  sounds are normal. She exhibits distension.  Musculoskeletal:        General: Edema present.     Comments: Bilateral LE edema ankles-feet  Neurological: She is oriented to person, place, and time.  Skin: Skin is warm and dry.  No site of entry for left top of foot pain there is edema present monitor  Psychiatric: She has a normal mood and affect.    BP 118/83 (BP Location: Right Arm, Patient Position: Sitting, Cuff Size: Normal)   Pulse 71   Temp 98 F (36.7 C) (Tympanic)   Ht '5\' 1"'$  (1.549 m)   Wt 184 lb (83.5 kg)   SpO2 96%   BMI 34.77 kg/m  Wt Readings from Last 3 Encounters:  09/19/18 184 lb (83.5 kg)  05/02/18 184 lb 9.6 oz (83.7 kg)  03/10/18 176 lb 9.6 oz (80.1 kg)     Health Maintenance Due  Topic Date Due  . Hepatitis C Screening  07-May-1955  . HIV Screening  09/04/1972  . PAP SMEAR-Modifier  09/05/1978  . MAMMOGRAM  09/05/2007    There are no preventive care reminders to display for this patient.  Lab Results  Component Value Date   TSH 0.616 01/09/2018   Lab Results  Component Value Date   WBC 8.3 06/15/2017   HGB 15.3 (H) 06/15/2017   HCT 44.4 06/15/2017   MCV 90.1 06/15/2017   PLT 189 06/15/2017   Lab Results  Component Value Date   NA 132 (L) 01/09/2018   K 3.7 01/09/2018   CO2 19 (L) 01/09/2018   GLUCOSE 519 (HH) 01/09/2018   BUN 18 01/09/2018   CREATININE 1.09 (H) 01/09/2018   BILITOT 0.3 01/09/2018   ALKPHOS 99 01/09/2018   AST 12 01/09/2018   ALT 15 01/09/2018   PROT 7.4 01/09/2018   ALBUMIN 4.4 01/09/2018   CALCIUM 9.4 01/09/2018   ANIONGAP 12 06/15/2017   Lab Results  Component Value Date   CHOL 155 01/18/2018   Lab Results  Component Value Date   HDL 54 01/18/2018   Lab Results  Component Value Date   LDLCALC 79 01/18/2018   Lab Results  Component Value Date   TRIG 109 01/18/2018   Lab Results  Component Value Date   CHOLHDL 2.9 01/18/2018   Lab Results  Component Value Date   HGBA1C 10.9 (A) 09/19/2018  Assessment & Plan:   Problem List Items Addressed This Visit    None    Visit Diagnoses    Type 2 diabetes mellitus without complication, without long-term current use of insulin (HCC)    -  Primary   Relevant Medications   insulin aspart (novoLOG) injection 10 Units (Completed)   Other Relevant Orders   Glucose (CBG) (Completed)   HgB A1c (Completed)   Lipid Panel   CMP14+EGFR   CBC with Differential/Platelet   Need for hepatitis C screening test       Relevant Orders   Hepatitis C Antibody   Encounter for screening for HIV       Relevant Orders   HIV Antibody (routine testing w rflx)   Breast cancer screening       Relevant Orders   MM Digital Diagnostic Bilat   Tick borne fever       Tobacco abuse          Meds ordered this encounter  Medications  . insulin aspart (novoLOG) injection 10 Units   Tricia Ramsey was seen today for follow-up.  Diagnoses and all orders for this visit:  Type 2 diabetes mellitus without complication, without long-term current use of insulin (HCC) Restart lantus at 25units after breakfast and 25 units after supper. Return visit you will bring in your meter ADA recommends the following therapeutic goals for glycemic control related to A1c measurements: Goal of therapy: Less than 6.5 hemoglobin A1c.  Reference clinical practice recommendations. Foods that are high in carbohydrates are the following rice, potatoes, breads, sugars, and pastas.  Reduction in the intake (eating) will assist in lowering your blood sugars. -     Glucose (CBG) -     HgB A1c -     insulin aspart (novoLOG) injection 10 Units -     Lipid Panel; Future -     CMP14+EGFR; Future -     CBC with Differential/Platelet; Future  Need for hepatitis C screening test -     Hepatitis C Antibody  Encounter for screening for HIV -     HIV Antibody (routine testing w rflx)  Breast cancer screening -     MM Digital Diagnostic Bilat; Future  Tobacco abuse Nicotine can decrease  circulation in your body and affect every organ. Reseach shows increase in lung cancer and respiratory problems. When you are ready to stop let's talk.  Follow-up: Return in about 3 months (around 12/20/2018) for uncontrolled diabetes .    Kerin Perna, NP

## 2018-09-20 LAB — HIV ANTIBODY (ROUTINE TESTING W REFLEX): HIV Screen 4th Generation wRfx: NONREACTIVE

## 2018-09-20 LAB — HEPATITIS C ANTIBODY: Hep C Virus Ab: <0.1 s/co ratio (ref 0.0–0.9)

## 2018-10-02 ENCOUNTER — Other Ambulatory Visit (INDEPENDENT_AMBULATORY_CARE_PROVIDER_SITE_OTHER): Payer: Medicaid Other

## 2018-10-02 ENCOUNTER — Other Ambulatory Visit: Payer: Self-pay

## 2018-10-02 DIAGNOSIS — E119 Type 2 diabetes mellitus without complications: Secondary | ICD-10-CM

## 2018-10-02 MED FILL — ?LOVASTATIN 40MG TABS: 40 | 30 days supply | Qty: 15 | Fill #4

## 2018-10-02 MED FILL — LISINOPRIL 2.5 MG TABLET: 2.5 | 15 days supply | Qty: 30 | Fill #1

## 2018-10-03 ENCOUNTER — Telehealth (INDEPENDENT_AMBULATORY_CARE_PROVIDER_SITE_OTHER): Payer: Self-pay

## 2018-10-03 LAB — CMP14+EGFR
ALT: 15 IU/L (ref 0–32)
AST: 14 IU/L (ref 0–40)
Albumin/Globulin Ratio: 1.5 (ref 1.2–2.2)
Albumin: 4 g/dL (ref 3.8–4.8)
Alkaline Phosphatase: 63 IU/L (ref 39–117)
BUN/Creatinine Ratio: 9 — ABNORMAL LOW (ref 12–28)
BUN: 9 mg/dL (ref 8–27)
Bilirubin Total: 0.5 mg/dL (ref 0.0–1.2)
CO2: 21 mmol/L (ref 20–29)
Calcium: 8.9 mg/dL (ref 8.7–10.3)
Chloride: 104 mmol/L (ref 96–106)
Creatinine, Ser: 1.02 mg/dL — ABNORMAL HIGH (ref 0.57–1.00)
GFR calc Af Amer: 69 mL/min/{1.73_m2} (ref >59)
GFR calc non Af Amer: 60 mL/min/{1.73_m2} (ref >59)
Globulin, Total: 2.7 g/dL (ref 1.5–4.5)
Glucose: 199 mg/dL — ABNORMAL HIGH (ref 65–99)
Potassium: 3.7 mmol/L (ref 3.5–5.2)
Sodium: 138 mmol/L (ref 134–144)
Total Protein: 6.7 g/dL (ref 6.0–8.5)

## 2018-10-03 LAB — LIPID PANEL
Chol/HDL Ratio: 3.4 ratio (ref 0.0–4.4)
Cholesterol, Total: 158 mg/dL (ref 100–199)
HDL: 46 mg/dL (ref >39)
LDL Calculated: 69 mg/dL (ref 0–99)
Triglycerides: 216 mg/dL — ABNORMAL HIGH (ref 0–149)
VLDL Cholesterol Cal: 43 mg/dL — ABNORMAL HIGH (ref 5–40)

## 2018-10-03 LAB — CBC WITH DIFFERENTIAL/PLATELET
Basophils Absolute: 0 10*3/uL (ref 0.0–0.2)
Basos: 1 %
EOS (ABSOLUTE): 0.1 10*3/uL (ref 0.0–0.4)
Eos: 1 %
Hematocrit: 42.1 % (ref 34.0–46.6)
Hemoglobin: 14 g/dL (ref 11.1–15.9)
Immature Grans (Abs): 0 10*3/uL (ref 0.0–0.1)
Immature Granulocytes: 0 %
Lymphocytes Absolute: 1.9 10*3/uL (ref 0.7–3.1)
Lymphs: 24 %
MCH: 30.6 pg (ref 26.6–33.0)
MCHC: 33.3 g/dL (ref 31.5–35.7)
MCV: 92 fL (ref 79–97)
Monocytes Absolute: 0.5 10*3/uL (ref 0.1–0.9)
Monocytes: 6 %
Neutrophils Absolute: 5.5 10*3/uL (ref 1.4–7.0)
Neutrophils: 68 %
Platelets: 210 10*3/uL (ref 150–450)
RBC: 4.58 x10E6/uL (ref 3.77–5.28)
RDW: 12.7 % (ref 11.7–15.4)
WBC: 8 10*3/uL (ref 3.4–10.8)

## 2018-10-03 NOTE — Telephone Encounter (Signed)
Patient is aware that labs are good except for elevated cholesterol. Advised patient to continue taking Lovastatin and decrease the consumption of foods high in cholesterol; fried foods, butter, avocados, nuts/seeds and chocolate. Patient expressed understanding. Nat Christen, CMA

## 2018-10-03 NOTE — Telephone Encounter (Signed)
-----   Message from Kerin Perna, NP sent at 10/03/2018  2:20 PM EDT ----- Labs are good except cholesterol. Foods high in cholesterol or liver, fatty meats,cheese, butter avocados, nuts and seeds, chocolate and fried foods.

## 2018-10-09 MED FILL — ?ARIPRAZOLE 5 MG TABS: 5 | 30 days supply | Qty: 30 | Fill #2

## 2018-10-09 MED FILL — METFORMIN HCL ER 500 MG TB2: 500 | 30 days supply | Qty: 60 | Fill #4

## 2018-10-23 MED FILL — PROPRANOLOL 60 MG TABLET: 60 | 30 days supply | Qty: 60 | Fill #2

## 2018-10-23 MED FILL — GLIMEPIRIDE 4 MG TABS: 4 | 30 days supply | Qty: 30 | Fill #0

## 2018-10-23 MED FILL — LISINOPRIL 2.5 MG TABLET: 2.5 | 15 days supply | Qty: 30 | Fill #2

## 2018-10-23 MED FILL — ?PAROXETINE HCL 40 MG TABS: 40 | 30 days supply | Qty: 30 | Fill #1

## 2018-10-27 MED FILL — $JANUVIA 100 MG TABLET: 100 | 30 days supply | Qty: 30 | Fill #1

## 2018-11-09 MED FILL — LISINOPRIL 2.5 MG TABLET: 2.5 | 15 days supply | Qty: 30 | Fill #3

## 2018-11-09 MED FILL — ?LOVASTATIN 40MG TABS: 40 | 30 days supply | Qty: 15 | Fill #5

## 2018-11-14 MED FILL — METFORMIN HCL ER 500 MG TB2: 500 | 30 days supply | Qty: 60 | Fill #5

## 2018-11-14 MED FILL — ?ARIPRAZOLE 5 MG TABS: 5 | 30 days supply | Qty: 30 | Fill #3

## 2018-12-04 ENCOUNTER — Other Ambulatory Visit: Payer: Self-pay | Admitting: Family Medicine

## 2018-12-04 DIAGNOSIS — G250 Essential tremor: Secondary | ICD-10-CM

## 2018-12-04 MED FILL — LISINOPRIL 2.5 MG TABLET: 2.5 | 15 days supply | Qty: 30 | Fill #3

## 2018-12-04 MED FILL — ?PAROXETINE HCL 40 MG TABS: 40 | 30 days supply | Qty: 30 | Fill #2

## 2018-12-04 MED FILL — PROPRANOLOL 60 MG TABLET: 60 | 30 days supply | Qty: 60 | Fill #0

## 2018-12-04 MED FILL — ?LOVASTATIN 40MG TABS: 40 | 30 days supply | Qty: 15 | Fill #6

## 2018-12-04 MED FILL — $JANUVIA 100 MG TABLET: 100 | 90 days supply | Qty: 90 | Fill #2

## 2018-12-04 MED FILL — GLIMEPIRIDE 4 MG TABS: 4 | 30 days supply | Qty: 30 | Fill #1

## 2018-12-04 NOTE — Progress Notes (Signed)
Virtual Visit via Telephone Note The purpose of this virtual visit is to provide medical care while limiting exposure to the novel coronavirus.    Consent was obtained for phone visit:  Yes.   Answered questions that patient had about telephone interaction:  Yes.   I discussed the limitations, risks, security and privacy concerns of performing an evaluation and management service by telephone. I also discussed with the patient that there may be a patient responsible charge related to this service. The patient expressed understanding and agreed to proceed.  Pt location: Home Physician Location: office Name of referring provider:  Clent Demark, PA-C I connected with Tricia Ramsey at patients initiation/request on 12/06/2018 at  8:15 AM EDT by telephone and verified that I am speaking with the correct person using two identifiers. Pt MRN:  [id number] Pt DOB:  08/10/1955 Video Participants:  Tricia Ramsey;     History of Present Illness:  The patient is seen today in follow-up for her mild rest tremor (was on Abilify for short period of time) as well as her very mild essential tremor.    She is on a higher dose of propranolol than she was last visit, at our recommendation.  She is now on 60 mg twice per day (was previously on 40 mg twice per day).  Today, she reports that she was doing really good until "I started back on insulin shots and then I started shaking in my cheeks and arm."  She quit taking it for an entire month and still had the tremor.  She is noting R arm and R "cheek" tremor.  It is a rest tremor.  No falls.  Balance is good.   Current Outpatient Medications on File Prior to Visit  Medication Sig Dispense Refill  . glimepiride (AMARYL) 4 MG tablet Take 1 tablet (4 mg total) by mouth daily before breakfast. Needs office visit for more refills. 30 tablet 3  . Insulin Glargine (LANTUS SOLOSTAR) 100 UNIT/ML Solostar Pen Inject 25 Units into the skin 2 (two) times daily. 5 pen 5   . Insulin Pen Needle (PEN NEEDLES) 31G X 5 MM MISC Inject 1 each into the skin at bedtime. 100 each 3  . lisinopril (ZESTRIL) 2.5 MG tablet Take 2 tablets (5 mg total) by mouth daily. 30 tablet 3  . lovastatin (MEVACOR) 40 MG tablet Take 0.5 tablets (20 mg total) by mouth at bedtime. 30 tablet 3  . metFORMIN (GLUCOPHAGE XR) 500 MG 24 hr tablet Take 2 tablets (1,000 mg total) by mouth daily with breakfast. 60 tablet 3  . PARoxetine (PAXIL) 40 MG tablet Take 1 tablet (40 mg total) by mouth every morning. 30 tablet 3  . propranolol (INDERAL) 60 MG tablet TAKE 1 TABLET (60 MG TOTAL) BY MOUTH 2 (TWO) TIMES DAILY. 60 tablet 2  . sitaGLIPtin (JANUVIA) 100 MG tablet Take 1 tablet (100 mg total) by mouth daily. 30 tablet 3   No current facility-administered medications on file prior to visit.      Observations/Objective:   Vitals:   12/06/18 0748  Weight: 194 lb (88 kg)  Height: 5\' 1"  (1.549 m)   Unfortunately, unable to examine her tremor today, as patient scheduled a phone visit.  Did call her the day before and asked her if we could do a video visit, but [place] capability and lives so far away that she couldn't come in  Assessment and Plan:  1.  Tremor             -Patient previously had 2 different types of tremor.  She had a rare, intermittent rest tremor (had been exposed to Abilify which increased the tremor, but has been off of Abilify for quite some time now).  She also has had mild essential tremor.  I had wanted to see her back just to make sure that nothing else was developing, but unfortunately was not able to see her back in person or via video, as this was just a phone visit.  It sounds like the right rest tremor has gotten worse.  She is living now at [facility], but states that she keeps her doctors back here.  I told her I really needed to see her in person.  She was agreeable to that.  I will make her an appointment within the next few  weeks.  -For now, she will continue her propranolol 60 mg bid  2.  Tobacco abuse             -Currently smoking 1 packs/day               -Understands importance of smoking cessation.  Follow Up Instructions:  Will asked my office to get her an appointment within the next few weeks so that I can see her in person.  -I discussed the assessment and treatment plan with the patient. The patient was provided an opportunity to ask questions and all were answered. The patient agreed with the plan and demonstrated an understanding of the instructions.   The patient was advised to call back or seek an in-person evaluation if the symptoms worsen or if the condition fails to improve as anticipated.    Total Time spent in visit with the patient was:  7 min, of which more than 50% of the time was spent in counseling.   Pt understands and agrees with the plan of care outlined.     Kerin Salen, DO

## 2018-12-06 ENCOUNTER — Telehealth (INDEPENDENT_AMBULATORY_CARE_PROVIDER_SITE_OTHER): Payer: Self-pay | Admitting: Neurology

## 2018-12-06 ENCOUNTER — Encounter: Payer: Self-pay | Admitting: Neurology

## 2018-12-06 ENCOUNTER — Other Ambulatory Visit: Payer: Self-pay

## 2018-12-06 VITALS — Ht 61.0 in | Wt 194.0 lb

## 2018-12-06 DIAGNOSIS — R259 Unspecified abnormal involuntary movements: Secondary | ICD-10-CM

## 2018-12-08 MED FILL — $LANTUS SOLOSTAR 100 UNITS/: 100 | 26 days supply | Qty: 12 | Fill #4

## 2018-12-15 NOTE — Progress Notes (Signed)
Tricia Ramsey was seen today in follow up for tremor.   I wanted to see her back in person.  She now lives at [facility], and last visit was only a phone visit.  She expressed concern at that visit that her right hand tremor was getting worse. She notes R hand tremor most when she isn't using it.   She is on propranolol, 60 mg twice per day for tremor, but didn't take it this morning.  She has a history of remote exposure to Abilify, but has been off of that since last December and was only exposed for about a month.  Pt denies falls.  Pt denies lightheadedness, near syncope.  No hallucinations.  Mood has been anxious.  She has trouble staying and maintaining sleep.  She has no vivid dreams and no acting out of the dreams.    Current movement disorder medications: Inderal 60 mg bid   PREVIOUS MEDICATIONS: inderal; off abilify since 02/2018  ALLERGIES:  No Known Allergies  CURRENT MEDICATIONS:  Outpatient Encounter Medications as of 12/19/2018  Medication Sig  . glimepiride (AMARYL) 4 MG tablet Take 1 tablet (4 mg total) by mouth daily before breakfast. Needs office visit for more refills.  . Insulin Glargine (LANTUS SOLOSTAR) 100 UNIT/ML Solostar Pen Inject 25 Units into the skin 2 (two) times daily.  . Insulin Pen Needle (PEN NEEDLES) 31G X 5 MM MISC Inject 1 each into the skin at bedtime.  Marland Kitchen lisinopril (ZESTRIL) 2.5 MG tablet Take 2 tablets (5 mg total) by mouth daily.  Marland Kitchen lovastatin (MEVACOR) 40 MG tablet Take 0.5 tablets (20 mg total) by mouth at bedtime.  . metFORMIN (GLUCOPHAGE XR) 500 MG 24 hr tablet Take 2 tablets (1,000 mg total) by mouth daily with breakfast.  . PARoxetine (PAXIL) 40 MG tablet Take 1 tablet (40 mg total) by mouth every morning.  . propranolol (INDERAL) 60 MG tablet TAKE 1 TABLET (60 MG TOTAL) BY MOUTH 2 (TWO) TIMES DAILY.  . sitaGLIPtin (JANUVIA) 100 MG tablet Take 1 tablet (100 mg total) by mouth daily.   No facility-administered encounter medications on file as  of 12/19/2018.     PAST MEDICAL HISTORY:   Past Medical History:  Diagnosis Date  . Anxiety   . Diabetes mellitus     PAST SURGICAL HISTORY:   Past Surgical History:  Procedure Laterality Date  . ABDOMINAL HYSTERECTOMY    . CHOLECYSTECTOMY    . SHOULDER ARTHROSCOPY W/ ROTATOR CUFF REPAIR      SOCIAL HISTORY:   Social History   Socioeconomic History  . Marital status: Married    Spouse name: Not on file  . Number of children: 3  . Years of education: Not on file  . Highest education level: 11th grade  Occupational History  . Not on file  Social Needs  . Financial resource strain: Not on file  . Food insecurity    Worry: Not on file    Inability: Not on file  . Transportation needs    Medical: Not on file    Non-medical: Not on file  Tobacco Use  . Smoking status: Current Every Day Smoker    Packs/day: 1.00    Types: Cigarettes  . Smokeless tobacco: Never Used  Substance and Sexual Activity  . Alcohol use: No  . Drug use: No  . Sexual activity: Not on file  Lifestyle  . Physical activity    Days per week: Not on file    Minutes  per session: Not on file  . Stress: Not on file  Relationships  . Social Herbalist on phone: Not on file    Gets together: Not on file    Attends religious service: Not on file    Active member of club or organization: Not on file    Attends meetings of clubs or organizations: Not on file    Relationship status: Not on file  . Intimate partner violence    Fear of current or ex partner: Not on file    Emotionally abused: Not on file    Physically abused: Not on file    Forced sexual activity: Not on file  Other Topics Concern  . Not on file  Social History Narrative  . Not on file    FAMILY HISTORY:   Family Status  Relation Name Status  . Mother  Alive  . Father  Alive  . Sister  Deceased  . Brother  Alive  . Son  Alive  . Daughter 2 Alive    ROS:  Review of Systems  Constitutional: Negative.   HENT:  Negative.   Eyes: Negative.   Respiratory: Negative.   Cardiovascular: Negative.   Gastrointestinal: Negative.   Skin: Negative.   Neurological: Positive for tremors.  Endo/Heme/Allergies: Negative.     PHYSICAL EXAMINATION:    VITALS:   Vitals:   12/19/18 0807  BP: 95/72  Pulse: 86  SpO2: 95%  Weight: 195 lb (88.5 kg)  Height: 5' 2.25" (1.581 m)    GEN:  The patient appears stated age and is in NAD. HEENT:  Normocephalic, atraumatic.  The mucous membranes are moist. The superficial temporal arteries are without ropiness or tenderness. CV:  RRR Lungs:  CTAB Neck/HEME:  There are no carotid bruits bilaterally.  Neurological examination:  Orientation: The patient is alert and oriented x3. Cranial nerves: There is good facial symmetry with mild facial hypomimia. The speech is fluent and clear. Soft palate rises symmetrically and there is no tongue deviation. Hearing is intact to conversational tone. Sensation: Sensation is intact to light touch throughout Motor: Strength is at least antigravity x4.  Movement examination: Tone: There is mild increased tone in the RUE Abnormal movements: there is RUE rest tremor.  There is head tremor.  There is more rare leg tremor at rest Coordination:  There is no decremation with RAM's, with any form of RAMS, including alternating supination and pronation of the forearm, hand opening and closing, finger taps, heel taps and toe taps bilaterally Gait and Station: The patient has no difficulty arising out of a deep-seated chair without the use of the hands. The patient's stride length is good.     ASSESSMENT/PLAN:  1.  Parkinsons disease, new dx 12/19/18, very mild  -We discussed the diagnosis as well as pathophysiology of the disease.  We discussed treatment options as well as prognostic indicators.  Patient education was provided.  -We discussed that it used to be thought that levodopa would increase risk of melanoma but now it is believed  that Parkinsons itself likely increases risk of melanoma. she is to get regular skin checks.  -We decided to start pramipexole and work to 0.5 mg tid.  Discussed r/b/se including but not limited to compulsive behaviors and sleep attacks.  Pt expressed understanding.  -discussed importance of exercise  -We discussed community resources in the area including patient support groups and community exercise programs for PD and pt education was provided to the patient.  -  decrease propranolol to once per day.  May d/c it next visit.  BP very low today  2.  Follow up is anticipated in the next 3 months via vv (lives 3 hours away) and 6 months in person.  Much greater than 50% of this visit was spent in counseling and coordinating care.  Total face to face time:  40 min   Cc:  Grayce SessionsEdwards, Michelle P, NP

## 2018-12-19 ENCOUNTER — Encounter (INDEPENDENT_AMBULATORY_CARE_PROVIDER_SITE_OTHER): Payer: Self-pay | Admitting: Primary Care

## 2018-12-19 ENCOUNTER — Encounter: Payer: Self-pay | Admitting: Neurology

## 2018-12-19 ENCOUNTER — Other Ambulatory Visit: Payer: Self-pay

## 2018-12-19 ENCOUNTER — Ambulatory Visit (INDEPENDENT_AMBULATORY_CARE_PROVIDER_SITE_OTHER): Payer: Self-pay | Admitting: Primary Care

## 2018-12-19 ENCOUNTER — Ambulatory Visit (INDEPENDENT_AMBULATORY_CARE_PROVIDER_SITE_OTHER): Payer: Self-pay | Admitting: Neurology

## 2018-12-19 VITALS — BP 109/74 | HR 105 | Temp 97.3°F | Wt 195.6 lb

## 2018-12-19 VITALS — BP 95/72 | HR 86 | Ht 62.25 in | Wt 195.0 lb

## 2018-12-19 DIAGNOSIS — G20 Parkinson's disease: Secondary | ICD-10-CM

## 2018-12-19 DIAGNOSIS — G250 Essential tremor: Secondary | ICD-10-CM

## 2018-12-19 DIAGNOSIS — I952 Hypotension due to drugs: Secondary | ICD-10-CM

## 2018-12-19 DIAGNOSIS — E119 Type 2 diabetes mellitus without complications: Secondary | ICD-10-CM

## 2018-12-19 DIAGNOSIS — I959 Hypotension, unspecified: Secondary | ICD-10-CM

## 2018-12-19 DIAGNOSIS — F418 Other specified anxiety disorders: Secondary | ICD-10-CM

## 2018-12-19 DIAGNOSIS — E785 Hyperlipidemia, unspecified: Secondary | ICD-10-CM

## 2018-12-19 LAB — POCT GLYCOSYLATED HEMOGLOBIN (HGB A1C): Hemoglobin A1C: 8.6 % — AB (ref 4.0–5.6)

## 2018-12-19 LAB — GLUCOSE, POCT (MANUAL RESULT ENTRY): POC Glucose: 194 mg/dl — AB (ref 70–99)

## 2018-12-19 MED ORDER — PEN NEEDLES 31G X 5 MM MISC: 1.0000 | Freq: Every day | 3 refills | Status: DC

## 2018-12-19 MED ORDER — PRAMIPEXOLE DIHYDROCHLORIDE 0.5 MG PO TABS: 0.5000 mg | ORAL_TABLET | Freq: Three times a day (TID) | ORAL | 1 refills | Status: DC

## 2018-12-19 MED ORDER — LANTUS SOLOSTAR 100 UNIT/ML ~~LOC~~ SOPN: 25.0000 [IU] | PEN_INJECTOR | Freq: Two times a day (BID) | SUBCUTANEOUS | 5 refills | Status: DC

## 2018-12-19 MED ORDER — PAROXETINE HCL 40 MG PO TABS: 40.0000 mg | ORAL_TABLET | ORAL | 3 refills | Status: DC

## 2018-12-19 MED ORDER — PRAMIPEXOLE DIHYDROCHLORIDE 0.125 MG PO TABS: ORAL_TABLET | ORAL | 0 refills | Status: DC

## 2018-12-19 MED ORDER — LISINOPRIL 2.5 MG PO TABS: 5.0000 mg | ORAL_TABLET | Freq: Every day | ORAL | 3 refills | Status: DC

## 2018-12-19 MED ORDER — GLIMEPIRIDE 4 MG PO TABS: 4.0000 mg | ORAL_TABLET | Freq: Every day | ORAL | 3 refills | Status: DC

## 2018-12-19 MED ORDER — LOVASTATIN 40 MG PO TABS: 20.0000 mg | ORAL_TABLET | Freq: Every day | ORAL | 3 refills | Status: DC

## 2018-12-19 MED ORDER — METFORMIN HCL ER 500 MG PO TB24: 1000.0000 mg | ORAL_TABLET | Freq: Every day | ORAL | 3 refills | Status: DC

## 2018-12-19 MED FILL — PRAMIPEXOLE 0.125 MG TABLET: 0.125 | 14 days supply | Qty: 63 | Fill #0

## 2018-12-19 MED FILL — TRUEPLUS PEN NDL 31G X 1/4: 31G X 6 MM | 25 days supply | Qty: 100 | Fill #0

## 2018-12-19 MED FILL — LISINOPRIL 2.5 MG TABLET: 2.5 | 15 days supply | Qty: 30 | Fill #4

## 2018-12-19 MED FILL — METFORMIN HCL ER 500 MG TB2: 500 | 30 days supply | Qty: 60 | Fill #0

## 2018-12-19 MED FILL — ?ARIPRAZOLE 5 MG TABS: 5 | 30 days supply | Qty: 30 | Fill #4

## 2018-12-19 NOTE — Patient Instructions (Addendum)
Start mirapex (pramipexole) as follows:  0.125 mg - 1 tablet three times per day for a week, then 2 tablets three times per day for a week and then fill the 0.5 mg tablet and take that, 1 pill three times per day   You need to exercise faithfully, at least 3 hours per week, prefer stationary biking with a trainer  Decrease propranolol to 60 mg once per day

## 2018-12-19 NOTE — Progress Notes (Signed)
Established Patient Office Visit  Subjective:  Patient ID: Tricia Ramsey, female    DOB: 1955/10/31  Age: 63 y.o. MRN: [id number]  CC:  Chief Complaint  Patient presents with  . Diabetes    HPI Tricia Ramsey presents for management of diabetes 09/19/2018 A1C was 10.9. Today 8.6 improvement . Hypotensive 109/74 she takes propanol twice a day she denies , headaches , dizziness or shortness of breath.  Past Medical History:  Diagnosis Date  . Anxiety   . Diabetes mellitus     Past Surgical History:  Procedure Laterality Date  . ABDOMINAL HYSTERECTOMY    . CHOLECYSTECTOMY    . SHOULDER ARTHROSCOPY W/ ROTATOR CUFF REPAIR      Family History  Problem Relation Age of Onset  . Alzheimer's disease Mother   . Diabetes Mother   . Other Father        unknown  . Cancer Sister   . Other Brother        unknown  . Healthy Son   . Healthy Daughter     Social History   Socioeconomic History  . Marital status: Married    Spouse name: Not on file  . Number of children: 3  . Years of education: Not on file  . Highest education level: 11th grade  Occupational History  . Not on file  Social Needs  . Financial resource strain: Not on file  . Food insecurity    Worry: Not on file    Inability: Not on file  . Transportation needs    Medical: Not on file    Non-medical: Not on file  Tobacco Use  . Smoking status: Current Every Day Smoker    Packs/day: 1.00    Types: Cigarettes  . Smokeless tobacco: Never Used  Substance and Sexual Activity  . Alcohol use: No  . Drug use: No  . Sexual activity: Not on file  Lifestyle  . Physical activity    Days per week: Not on file    Minutes per session: Not on file  . Stress: Not on file  Relationships  . Social Herbalist on phone: Not on file    Gets together: Not on file    Attends religious service: Not on file    Active member of club or organization: Not on file    Attends meetings of clubs or organizations: Not  on file    Relationship status: Not on file  . Intimate partner violence    Fear of current or ex partner: Not on file    Emotionally abused: Not on file    Physically abused: Not on file    Forced sexual activity: Not on file  Other Topics Concern  . Not on file  Social History Narrative  . Not on file    Outpatient Medications Prior to Visit  Medication Sig Dispense Refill  . pramipexole (MIRAPEX) 0.125 MG tablet 1 tablet three times per day for a week, then 2 tablets three times per day for a week 73 tablet 0  . pramipexole (MIRAPEX) 0.5 MG tablet Take 1 tablet (0.5 mg total) by mouth 3 (three) times daily. 270 tablet 1  . propranolol (INDERAL) 60 MG tablet TAKE 1 TABLET (60 MG TOTAL) BY MOUTH 2 (TWO) TIMES DAILY. 60 tablet 2  . sitaGLIPtin (JANUVIA) 100 MG tablet Take 1 tablet (100 mg total) by mouth daily. 30 tablet 3  . glimepiride (AMARYL) 4 MG tablet Take 1 tablet (  4 mg total) by mouth daily before breakfast. Needs office visit for more refills. 30 tablet 3  . Insulin Glargine (LANTUS SOLOSTAR) 100 UNIT/ML Solostar Pen Inject 25 Units into the skin 2 (two) times daily. 5 pen 5  . Insulin Pen Needle (PEN NEEDLES) 31G X 5 MM MISC Inject 1 each into the skin at bedtime. 100 each 3  . lisinopril (ZESTRIL) 2.5 MG tablet Take 2 tablets (5 mg total) by mouth daily. 30 tablet 3  . lovastatin (MEVACOR) 40 MG tablet Take 0.5 tablets (20 mg total) by mouth at bedtime. 30 tablet 3  . metFORMIN (GLUCOPHAGE XR) 500 MG 24 hr tablet Take 2 tablets (1,000 mg total) by mouth daily with breakfast. 60 tablet 3  . PARoxetine (PAXIL) 40 MG tablet Take 1 tablet (40 mg total) by mouth every morning. 30 tablet 3   No facility-administered medications prior to visit.     No Known Allergies  ROS Review of Systems  Neurological: Positive for tremors and weakness.  All other systems reviewed and are negative.     Objective:    Physical Exam  Constitutional: She is oriented to person, place, and  time. She appears well-developed and well-nourished.  HENT:  Head: Normocephalic.  Neck: Normal range of motion. Neck supple.  Cardiovascular: Normal rate and regular rhythm.  Pulmonary/Chest: Effort normal and breath sounds normal.  Abdominal: Soft. Bowel sounds are normal. She exhibits distension.  Musculoskeletal: Normal range of motion.  Neurological: She is oriented to person, place, and time.  Psychiatric: She has a normal mood and affect.    BP 109/74 (BP Location: Right Arm, Patient Position: Sitting, Cuff Size: Normal)   Pulse (!) 105   Temp (!) 97.3 F (36.3 C) (Temporal)   Wt 195 lb 9.6 oz (88.7 kg)   SpO2 97%   BMI 35.49 kg/m  Wt Readings from Last 3 Encounters:  12/19/18 195 lb 9.6 oz (88.7 kg)  12/19/18 195 lb (88.5 kg)  12/06/18 194 lb (88 kg)     Health Maintenance Due  Topic Date Due  . PAP SMEAR-Modifier  09/05/1978  . MAMMOGRAM  09/05/2007  . INFLUENZA VACCINE  10/07/2018    There are no preventive care reminders to display for this patient.  Lab Results  Component Value Date   TSH 0.616 01/09/2018   Lab Results  Component Value Date   WBC 8.0 10/02/2018   HGB 14.0 10/02/2018   HCT 42.1 10/02/2018   MCV 92 10/02/2018   PLT 210 10/02/2018   Lab Results  Component Value Date   NA 138 10/02/2018   K 3.7 10/02/2018   CO2 21 10/02/2018   GLUCOSE 199 (H) 10/02/2018   BUN 9 10/02/2018   CREATININE 1.02 (H) 10/02/2018   BILITOT 0.5 10/02/2018   ALKPHOS 63 10/02/2018   AST 14 10/02/2018   ALT 15 10/02/2018   PROT 6.7 10/02/2018   ALBUMIN 4.0 10/02/2018   CALCIUM 8.9 10/02/2018   ANIONGAP 12 06/15/2017   Lab Results  Component Value Date   CHOL 158 10/02/2018   Lab Results  Component Value Date   HDL 46 10/02/2018   Lab Results  Component Value Date   LDLCALC 69 10/02/2018   Lab Results  Component Value Date   TRIG 216 (H) 10/02/2018   Lab Results  Component Value Date   CHOLHDL 3.4 10/02/2018   Lab Results  Component  Value Date   HGBA1C 8.6 (A) 12/19/2018      Assessment & Plan:  Meds ordered this encounter  Medications  . PARoxetine (PAXIL) 40 MG tablet    Sig: Take 1 tablet (40 mg total) by mouth every morning.    Dispense:  30 tablet    Refill:  3  . metFORMIN (GLUCOPHAGE XR) 500 MG 24 hr tablet    Sig: Take 2 tablets (1,000 mg total) by mouth daily with breakfast.    Dispense:  60 tablet    Refill:  3  . lovastatin (MEVACOR) 40 MG tablet    Sig: Take 0.5 tablets (20 mg total) by mouth at bedtime.    Dispense:  30 tablet    Refill:  3  . Insulin Glargine (LANTUS SOLOSTAR) 100 UNIT/ML Solostar Pen    Sig: Inject 25 Units into the skin 2 (two) times daily.    Dispense:  5 pen    Refill:  5  . glimepiride (AMARYL) 4 MG tablet    Sig: Take 1 tablet (4 mg total) by mouth daily before breakfast. Needs office visit for more refills.    Dispense:  30 tablet    Refill:  3    Needs office visit for more refills.  . Insulin Pen Needle (PEN NEEDLES) 31G X 5 MM MISC    Sig: Inject 1 each into the skin at bedtime.    Dispense:  100 each    Refill:  3  . lisinopril (ZESTRIL) 2.5 MG tablet    Sig: Take 2 tablets (5 mg total) by mouth daily.    Dispense:  30 tablet    Refill:  3  Christain was seen today for diabetes.  Diagnoses and all orders for this visit:  Type 2 diabetes mellitus without complication, without long-term current use of insulin (HCC)  A1c is improving reminded about foods that are high in carbohydrates are the following rice, potatoes, breads, sugars, and pastas.  Reduction in the intake (eating) will assist in lowering your blood sugars. -     Glucose (CBG) -     HgB A1c -     metFORMIN (GLUCOPHAGE XR) 500 MG 24 hr tablet; Take 2 tablets (1,000 mg total) by mouth daily with breakfast. -     lovastatin (MEVACOR) 40 MG tablet; Take 0.5 tablets (20 mg total) by mouth at bedtime. -     Insulin Glargine (LANTUS SOLOSTAR) 100 UNIT/ML Solostar Pen; Inject 25 Units into the skin 2 (two)  times daily. -     Insulin Pen Needle (PEN NEEDLES) 31G X 5 MM MISC; Inject 1 each into the skin at bedtime. -     lisinopril (ZESTRIL) 2.5 MG tablet; Take 2 tablets (5 mg total) by mouth daily. -     CBC with Differential/Platelet -     CMP14+EGFR  Essential tremor Followed by neurologist for Parkinson   Hypotensive episode BP is 109/74 seen by neurologist earlier today Bp was 95/72 medication change take  Propranolol 46m bid change to 668mdaily . Patient is able to check Bp at home f/u tele for Bp check in 2 weeks continue lisinopril 2.5 for renal protection . Depression with anxiety Controlled on  -     PARoxetine (PAXIL) 40 MG tablet; Take 1 tablet (40 mg total) by mouth every morning.  Hyperlipidemia, unspecified hyperlipidemia type Continue lovast decrease your fatty foods, red meat, cheese, milk and increase fiber like whole grains and veggies. You can also add a fiber supplement like Metamucil or Benefiber.  -     Lipid panel  Other orders -  glimepiride (AMARYL) 4 MG tablet; Take 1 tablet (4 mg total) by mouth daily before breakfast. Needs office visit for more refills.    Follow-up: Return in about 6 weeks (around 01/30/2019) for Tele visit for Bp .    Kerin Perna, NP

## 2018-12-19 NOTE — Patient Instructions (Signed)
Hypotension °As your heart beats, it forces blood through your body. This force is called blood pressure. If you have hypotension, you have low blood pressure. When your blood pressure is too low, you may not get enough blood to your brain or other parts of your body. This may cause you to feel weak, light-headed, have a fast heartbeat, or even pass out (faint). Low blood pressure may be harmless, or it may cause serious problems. °What are the causes? °· Blood loss. °· Not enough water in the body (dehydration). °· Heart problems. °· Hormone problems. °· Pregnancy. °· A very bad infection. °· Not having enough of certain nutrients. °· Very bad allergic reactions. °· Certain medicines. °What increases the risk? °· Age. The risk increases as you get older. °· Conditions that affect the heart or the brain and spinal cord (central nervous system). °· Taking certain medicines. °· Being pregnant. °What are the signs or symptoms? °· Feeling: °? Weak. °? Light-headed. °? Dizzy. °? Tired (fatigued). °· Blurred vision. °· Fast heartbeat. °· Passing out, in very bad cases. °How is this treated? °· Changing your diet. This may involve eating more salt (sodium) or drinking more water. °· Taking medicines to raise your blood pressure. °· Changing how much you take (the dosage) of some of your medicines. °· Wearing compression stockings. These stockings help to prevent blood clots and reduce swelling in your legs. °In some cases, you may need to go to the hospital for: °· Fluid replacement. This means you will receive fluids through an IV tube. °· Blood replacement. This means you will receive donated blood through an IV tube (transfusion). °· Treating an infection or heart problems, if this applies. °· Monitoring. You may need to be monitored while medicines that you are taking wear off. °Follow these instructions at home: °Eating and drinking ° °· Drink enough fluids to keep your pee (urine) pale yellow. °· Eat a healthy diet.  Follow instructions from your doctor about what you can eat or drink. A healthy diet includes: °? Fresh fruits and vegetables. °? Whole grains. °? Low-fat (lean) meats. °? Low-fat dairy products. °· Eat extra salt only as told. Do not add extra salt to your diet unless your doctor tells you to. °· Eat small meals often. °· Avoid standing up quickly after you eat. °Medicines °· Take over-the-counter and prescription medicines only as told by your doctor. °? Follow instructions from your doctor about changing how much you take of your medicines, if this applies. °? Do not stop or change any of your medicines on your own. °General instructions ° °· Wear compression stockings as told by your doctor. °· Get up slowly from lying down or sitting. °· Avoid hot showers and a lot of heat as told by your doctor. °· Return to your normal activities as told by your doctor. Ask what activities are safe for you. °· Do not use any products that contain nicotine or tobacco, such as cigarettes, e-cigarettes, and chewing tobacco. If you need help quitting, ask your doctor. °· Keep all follow-up visits as told by your doctor. This is important. °Contact a doctor if: °· You throw up (vomit). °· You have watery poop (diarrhea). °· You have a fever for more than 2-3 days. °· You feel more thirsty than normal. °· You feel weak and tired. °Get help right away if: °· You have chest pain. °· You have a fast or uneven heartbeat. °· You lose feeling (have numbness) in any   part of your body. °· You cannot move your arms or your legs. °· You have trouble talking. °· You get sweaty or feel light-headed. °· You pass out. °· You have trouble breathing. °· You have trouble staying awake. °· You feel mixed up (confused). °Summary °· Hypotension is also called low blood pressure. It is when the force of blood pumping through your arteries is too weak. °· Hypotension may be harmless, or it may cause serious problems. °· Treatment may include changing  your diet and medicines, and wearing compression stockings. °· In very bad cases, you may need to go to the hospital. °This information is not intended to replace advice given to you by your health care provider. Make sure you discuss any questions you have with your health care provider. °Document Released: 05/19/2009 Document Revised: 08/18/2017 Document Reviewed: 08/18/2017 °Elsevier Patient Education © 2020 [employer] ° °

## 2018-12-20 LAB — LIPID PANEL
Chol/HDL Ratio: 3.6 ratio (ref 0.0–4.4)
Cholesterol, Total: 183 mg/dL (ref 100–199)
HDL: 51 mg/dL (ref >39)
LDL Chol Calc (NIH): 87 mg/dL (ref 0–99)
Triglycerides: 271 mg/dL — ABNORMAL HIGH (ref 0–149)
VLDL Cholesterol Cal: 45 mg/dL — ABNORMAL HIGH (ref 5–40)

## 2018-12-20 LAB — CMP14+EGFR
ALT: 12 IU/L (ref 0–32)
AST: 12 IU/L (ref 0–40)
Albumin/Globulin Ratio: 1.6 (ref 1.2–2.2)
Albumin: 4.2 g/dL (ref 3.8–4.8)
Alkaline Phosphatase: 73 IU/L (ref 39–117)
BUN/Creatinine Ratio: 10 — ABNORMAL LOW (ref 12–28)
BUN: 12 mg/dL (ref 8–27)
Bilirubin Total: 0.4 mg/dL (ref 0.0–1.2)
CO2: 21 mmol/L (ref 20–29)
Calcium: 9.2 mg/dL (ref 8.7–10.3)
Chloride: 105 mmol/L (ref 96–106)
Creatinine, Ser: 1.22 mg/dL — ABNORMAL HIGH (ref 0.57–1.00)
GFR calc Af Amer: 55 mL/min/{1.73_m2} — ABNORMAL LOW (ref >59)
GFR calc non Af Amer: 48 mL/min/{1.73_m2} — ABNORMAL LOW (ref >59)
Globulin, Total: 2.7 g/dL (ref 1.5–4.5)
Glucose: 185 mg/dL — ABNORMAL HIGH (ref 65–99)
Potassium: 3.7 mmol/L (ref 3.5–5.2)
Sodium: 142 mmol/L (ref 134–144)
Total Protein: 6.9 g/dL (ref 6.0–8.5)

## 2018-12-20 LAB — CBC WITH DIFFERENTIAL/PLATELET
Basophils Absolute: 0.1 10*3/uL (ref 0.0–0.2)
Basos: 1 %
EOS (ABSOLUTE): 0.1 10*3/uL (ref 0.0–0.4)
Eos: 1 %
Hematocrit: 42.6 % (ref 34.0–46.6)
Hemoglobin: 14.7 g/dL (ref 11.1–15.9)
Immature Grans (Abs): 0.1 10*3/uL (ref 0.0–0.1)
Immature Granulocytes: 1 %
Lymphocytes Absolute: 2.8 10*3/uL (ref 0.7–3.1)
Lymphs: 30 %
MCH: 30.9 pg (ref 26.6–33.0)
MCHC: 34.5 g/dL (ref 31.5–35.7)
MCV: 90 fL (ref 79–97)
Monocytes Absolute: 0.5 10*3/uL (ref 0.1–0.9)
Monocytes: 5 %
Neutrophils Absolute: 5.9 10*3/uL (ref 1.4–7.0)
Neutrophils: 62 %
Platelets: 237 10*3/uL (ref 150–450)
RBC: 4.75 x10E6/uL (ref 3.77–5.28)
RDW: 12.6 % (ref 11.7–15.4)
WBC: 9.4 10*3/uL (ref 3.4–10.8)

## 2018-12-21 ENCOUNTER — Telehealth (INDEPENDENT_AMBULATORY_CARE_PROVIDER_SITE_OTHER): Payer: Self-pay

## 2018-12-21 NOTE — Telephone Encounter (Signed)
Patient was asleep. Patients husband is aware of results. He will inform patient that triglycerides are still elevated and to continue taking her Lovastatin every day. Encourage her to decrease fatty food, red meat, cheese and milk; increase fiber with whole grains and veggies. Patients husband repeated results back to ensure understanding. Nat Christen, CMA

## 2018-12-21 NOTE — Telephone Encounter (Signed)
-----   Message from Kerin Perna, NP sent at 12/21/2018  8:49 AM EDT ----- Triglycerides elevated continue Lovastatin 40mg  every Your LDL is not in range. Encourage to decrease your fatty foods, red meat, cheese, milk and increase fiber like whole grains and veggies.

## 2018-12-29 MED FILL — PRAMIPEXOLE 0.5 MG TABLET: 0.5 | 30 days supply | Qty: 90 | Fill #0

## 2019-01-01 MED FILL — LISINOPRIL 2.5 MG TABLET: 2.5 | 15 days supply | Qty: 30 | Fill #5

## 2019-01-01 MED FILL — ?PAROXETINE HCL 40 MG TABS: 40 | 30 days supply | Qty: 30 | Fill #3

## 2019-01-01 MED FILL — GLIMEPIRIDE 4 MG TABS: 4 | 30 days supply | Qty: 30 | Fill #2

## 2019-01-01 MED FILL — ?LOVASTATIN 40MG TABS: 40 | 30 days supply | Qty: 15 | Fill #7

## 2019-01-05 MED FILL — $LANTUS SOLOSTAR 100 UNITS/: 100 | 30 days supply | Qty: 15 | Fill #0

## 2019-01-22 MED FILL — METFORMIN HCL ER 500 MG TB2: 500 | 30 days supply | Qty: 60 | Fill #0

## 2019-01-29 ENCOUNTER — Other Ambulatory Visit: Payer: Self-pay

## 2019-01-29 ENCOUNTER — Ambulatory Visit (INDEPENDENT_AMBULATORY_CARE_PROVIDER_SITE_OTHER): Payer: Self-pay | Admitting: Primary Care

## 2019-01-29 ENCOUNTER — Encounter (INDEPENDENT_AMBULATORY_CARE_PROVIDER_SITE_OTHER): Payer: Self-pay | Admitting: Primary Care

## 2019-01-29 DIAGNOSIS — I10 Essential (primary) hypertension: Secondary | ICD-10-CM

## 2019-01-29 MED ORDER — PROPRANOLOL HCL 20 MG PO TABS: 30.0000 mg | ORAL_TABLET | Freq: Two times a day (BID) | ORAL | 1 refills | Status: DC

## 2019-01-29 NOTE — Progress Notes (Signed)
Virtual Visit via Telephone Note  I connected with Tricia Ramsey on 01/29/19 at  9:30 AM EST by telephone and verified that I am speaking with the correct person using two identifiers.   I discussed the limitations, risks, security and privacy concerns of performing an evaluation and management service by telephone and the availability of in person appointments. I also discussed with the patient that there may be a patient responsible charge related to this service. The patient expressed understanding and agreed to proceed.   History of Present Illness: Tricia Ramsey is having a tele visit for Blood pressure management systolic range from 916- 384 and diastolic 92- 61.  HR 92-100 She d denies shortness of breath, headaches, chest pain or lower extremity edema Past Medical History:  Diagnosis Date  . Anxiety   . Diabetes mellitus      Observations/Objective: Review of Systems  All other systems reviewed and are negative.   Assessment and Plan: Diagnoses and all orders for this visit:  Essential hypertension Blood pressure is low today 104/70 HR 100 decrease propranolol to 30mg  bid . Re-evaluate Bp in 6-8 weeks tele to re-evaluate on decreasing Bp medication. Patient voices understanding and in agreement with treatment plan.   Follow Up Instructions:    I discussed the assessment and treatment plan with the patient. The patient was provided an opportunity to ask questions and all were answered. The patient agreed with the plan and demonstrated an understanding of the instructions.   The patient was advised to call back or seek an in-person evaluation if the symptoms worsen or if the condition fails to improve as anticipated.  I provided 12 minutes of non-face-to-face time during this encounter.   Kerin Perna, NP

## 2019-01-29 NOTE — Progress Notes (Signed)
Bp at 8am Left arm 104/70  HR 92

## 2019-01-30 MED FILL — PRAMIPEXOLE 0.5 MG TABLET: 0.5 | 30 days supply | Qty: 90 | Fill #1

## 2019-01-30 MED FILL — ?LOVASTATIN 40MG TABS: 40 | 30 days supply | Qty: 15 | Fill #8

## 2019-01-30 MED FILL — GLIMEPIRIDE 4 MG TABS: 4 | 30 days supply | Qty: 30 | Fill #3

## 2019-01-30 MED FILL — ?PAROXETINE HCL 40 MG TABS: 40 | 30 days supply | Qty: 30 | Fill #4

## 2019-01-30 MED FILL — LISINOPRIL 2.5 MG TABLET: 2.5 | 30 days supply | Qty: 60 | Fill #0

## 2019-02-08 ENCOUNTER — Telehealth (INDEPENDENT_AMBULATORY_CARE_PROVIDER_SITE_OTHER): Payer: Self-pay

## 2019-02-08 NOTE — Telephone Encounter (Signed)
Patient called to make a medication refill for   ARIPiprazole (ABILIFY) 5 MG tablet    Patient uses [facility]    Please advice [phone]  Thank you Whitney Post

## 2019-02-12 MED FILL — $LANTUS SOLOSTAR 100 UNITS/: 100 | 30 days supply | Qty: 15 | Fill #1

## 2019-02-12 NOTE — Telephone Encounter (Signed)
FWD to PCP. Camaron Cammack S Adlynn Lowenstein, CMA  

## 2019-02-13 MED FILL — TRUEPLUS PEN NDL 31G X 1/4": 31G X 6 MM | 25 days supply | Qty: 100 | Fill #1

## 2019-02-13 MED FILL — TRUEPLUS PEN NDL 31G X 1/4: 31G X 6 MM | 25 days supply | Qty: 100 | Fill #1

## 2019-02-13 NOTE — Telephone Encounter (Signed)
Called Tricia Ramsey to inform will not refill Abilify review of notes indicated she did not tolerate medication. Patient voiced understanding

## 2019-02-19 MED FILL — METFORMIN HCL ER 500 MG TB2: 500 | 30 days supply | Qty: 60 | Fill #1

## 2019-03-05 ENCOUNTER — Other Ambulatory Visit: Payer: Self-pay

## 2019-03-05 ENCOUNTER — Encounter (INDEPENDENT_AMBULATORY_CARE_PROVIDER_SITE_OTHER): Payer: Self-pay | Admitting: Primary Care

## 2019-03-05 ENCOUNTER — Ambulatory Visit (INDEPENDENT_AMBULATORY_CARE_PROVIDER_SITE_OTHER): Payer: Self-pay | Admitting: Primary Care

## 2019-03-05 DIAGNOSIS — I10 Essential (primary) hypertension: Secondary | ICD-10-CM

## 2019-03-05 MED FILL — GLIMEPIRIDE 4 MG TABS: 4 | 30 days supply | Qty: 30 | Fill #0

## 2019-03-05 MED FILL — ?LOVASTATIN 40MG TABS: 40 | 30 days supply | Qty: 15 | Fill #9

## 2019-03-05 MED FILL — PRAMIPEXOLE 0.5 MG TABLET: 0.5 | 30 days supply | Qty: 90 | Fill #2

## 2019-03-05 MED FILL — LISINOPRIL 2.5 MG TABLET: 2.5 | 30 days supply | Qty: 60 | Fill #1

## 2019-03-05 MED FILL — ?PROPRANOLOL 20 MG TABLET: 20 | 30 days supply | Qty: 90 | Fill #1

## 2019-03-05 MED FILL — JANUVIA 100 MG TABLET: 100 | 30 days supply | Qty: 30 | Fill #3

## 2019-03-05 NOTE — Progress Notes (Signed)
Virtual Visit via Telephone Note  I connected with Tricia Ramsey on 03/05/19 at  9:50 AM EST by telephone and verified that I am speaking with the correct person using two identifiers.   I discussed the limitations, risks, security and privacy concerns of performing an evaluation and management service by telephone and the availability of in person appointments. I also discussed with the patient that there may be a patient responsible charge related to this service. The patient expressed understanding and agreed to proceed.   History of Present Illness: [name] is having a tele visit for evaluation of her blood pressure previous visit medication adjusted . Today her bp is 536/14 - (systolic 43-154) and diastolic (00-86). She denies shortness of breath, headaches, chest pain or lower extremity edema. Past Medical History:  Diagnosis Date  . Anxiety   . Diabetes mellitus      Current Outpatient Medications on File Prior to Visit  Medication Sig Dispense Refill  . glimepiride (AMARYL) 4 MG tablet Take 1 tablet (4 mg total) by mouth daily before breakfast. Needs office visit for more refills. 30 tablet 3  . Insulin Glargine (LANTUS SOLOSTAR) 100 UNIT/ML Solostar Pen Inject 25 Units into the skin 2 (two) times daily. 5 pen 5  . Insulin Pen Needle (PEN NEEDLES) 31G X 5 MM MISC Inject 1 each into the skin at bedtime. 100 each 3  . lisinopril (ZESTRIL) 2.5 MG tablet Take 2 tablets (5 mg total) by mouth daily. 30 tablet 3  . lovastatin (MEVACOR) 40 MG tablet Take 0.5 tablets (20 mg total) by mouth at bedtime. 30 tablet 3  . metFORMIN (GLUCOPHAGE XR) 500 MG 24 hr tablet Take 2 tablets (1,000 mg total) by mouth daily with breakfast. 60 tablet 3  . PARoxetine (PAXIL) 40 MG tablet Take 1 tablet (40 mg total) by mouth every morning. 30 tablet 3  . pramipexole (MIRAPEX) 0.125 MG tablet 1 tablet three times per day for a week, then 2 tablets three times per day for a week 73 tablet 0  .  pramipexole (MIRAPEX) 0.5 MG tablet Take 1 tablet (0.5 mg total) by mouth 3 (three) times daily. 270 tablet 1  . propranolol (INDERAL) 20 MG tablet Take 1.5 tablets (30 mg total) by mouth 2 (two) times daily. 135 tablet 1  . sitaGLIPtin (JANUVIA) 100 MG tablet Take 1 tablet (100 mg total) by mouth daily. 30 tablet 3   No current facility-administered medications on file prior to visit.   Observations/Objective: Review of Systems  All other systems reviewed and are negative.   Assessment and Plan: Essential hypertension Blood pressure is at goal 126/87 (</= 130/80) continue to follow  low-sodium medication compliance, recommended 30 mins daily or 150 minutes of moderate intensity exercise per week. Discussed medication compliance, adverse effects.  Follow Up Instructions:    I discussed the assessment and treatment plan with the patient. The patient was provided an opportunity to ask questions and all were answered. The patient agreed with the plan and demonstrated an understanding of the instructions.   The patient was advised to call back or seek an in-person evaluation if the symptoms worsen or if the condition fails to improve as anticipated.  I provided 12 minutes of non-face-to-face time during this encounter.   Kerin Perna, NP

## 2019-03-05 NOTE — Progress Notes (Signed)
BP at 8am left arm 126/87 HR 88

## 2019-03-12 MED FILL — $LANTUS SOLOSTAR 100 UNITS/: 100 | 30 days supply | Qty: 15 | Fill #2

## 2019-03-12 MED FILL — ?PAROXETINE HCL 40 MG TABS: 40 | 30 days supply | Qty: 30 | Fill #5

## 2019-03-14 ENCOUNTER — Encounter: Payer: Self-pay | Admitting: Neurology

## 2019-03-14 ENCOUNTER — Other Ambulatory Visit: Payer: Self-pay

## 2019-03-14 ENCOUNTER — Telehealth (INDEPENDENT_AMBULATORY_CARE_PROVIDER_SITE_OTHER): Payer: Self-pay | Admitting: Neurology

## 2019-03-14 DIAGNOSIS — G20 Parkinson's disease: Secondary | ICD-10-CM

## 2019-03-14 NOTE — Progress Notes (Signed)
Virtual Visit via Video Note The purpose of this virtual visit is to provide medical care while limiting exposure to the novel coronavirus.    Consent was obtained for video visit:  Yes.   Answered questions that patient had about telehealth interaction:  Yes.   I discussed the limitations, risks, security and privacy concerns of performing an evaluation and management service by telemedicine. I also discussed with the patient that there may be a patient responsible charge related to this service. The patient expressed understanding and agreed to proceed.  Pt location: Home Physician Location: office Name of referring provider:  Kerin Perna, NP I connected with Tricia Ramsey at patients initiation/request on 03/14/2019 at 10:15 AM EST by video enabled telemedicine application and verified that I am speaking with the correct person using two identifiers. Pt MRN:  [id number] Pt DOB:  Jan 20, 1956 Video Participants:  Tricia Ramsey;     History of Present Illness:  Patient seen today in follow-up for Parkinson's disease that was diagnosed last visit.  We started her on pramipexole, 0.5 mg 3 times per day.  She reports that she has been the same in terms of tremor.  No falls.  I had told her last visit to decrease propranolol to qday but she is still on tid dosing (on for tremor and not for BP).  She has had no compulsive behaviors.  No sleep attacks. Pt denies falls.  Pt denies lightheadedness, near syncope.  No hallucinations.  Mood has been good. My previous records as well as any outside records made available were reviewed prior to todays visit.  She saw her nurse practitioner on March 05, 2019.  No medication changes were made.  Current movement d/o meds: Propranolol 20 mg once per day Pramipexole 0.5 mg 3 times per day   Current Outpatient Medications on File Prior to Visit  Medication Sig Dispense Refill  . glimepiride (AMARYL) 4 MG tablet Take 1 tablet (4 mg total) by mouth  daily before breakfast. Needs office visit for more refills. 30 tablet 3  . Insulin Glargine (LANTUS SOLOSTAR) 100 UNIT/ML Solostar Pen Inject 25 Units into the skin 2 (two) times daily. 5 pen 5  . Insulin Pen Needle (PEN NEEDLES) 31G X 5 MM MISC Inject 1 each into the skin at bedtime. 100 each 3  . lisinopril (ZESTRIL) 2.5 MG tablet Take 2 tablets (5 mg total) by mouth daily. 30 tablet 3  . lovastatin (MEVACOR) 40 MG tablet Take 0.5 tablets (20 mg total) by mouth at bedtime. 30 tablet 3  . metFORMIN (GLUCOPHAGE XR) 500 MG 24 hr tablet Take 2 tablets (1,000 mg total) by mouth daily with breakfast. 60 tablet 3  . PARoxetine (PAXIL) 40 MG tablet Take 1 tablet (40 mg total) by mouth every morning. 30 tablet 3  . pramipexole (MIRAPEX) 0.125 MG tablet 1 tablet three times per day for a week, then 2 tablets three times per day for a week 73 tablet 0  . pramipexole (MIRAPEX) 0.5 MG tablet Take 1 tablet (0.5 mg total) by mouth 3 (three) times daily. 270 tablet 1  . propranolol (INDERAL) 20 MG tablet Take 1.5 tablets (30 mg total) by mouth 2 (two) times daily. 135 tablet 1  . sitaGLIPtin (JANUVIA) 100 MG tablet Take 1 tablet (100 mg total) by mouth daily. 30 tablet 3   No current facility-administered medications on file prior to visit.     Observations/Objective:   There were no vitals filed for this  visit. GEN:  The patient appears stated age and is in NAD.  Neurological examination:  Orientation: The patient is alert and oriented x3. Cranial nerves: There is good facial symmetry. There is no significant facial hypomimia.  The speech is fluent and clear. Soft palate rises symmetrically and there is no tongue deviation. Hearing is intact to conversational tone. Motor: Strength is at least antigravity x 4.   Shoulder shrug is equal and symmetric.  There is no pronator drift.  Movement examination: Tone: unable Abnormal movements: There is right upper extremity rest tremor with  ambulation Coordination:  There is no significant decremation with RAM's, with hand opening and closing, finger taps or the action of "turning in a light bulb" bilaterally Gait and Station: The patient has no difficulty arising.  She ambulates well in the home, with right upper extremity tremor.      Assessment and Plan:   1.  Parkinson's disease, diagnosed October, 2020  -Long discussion with the patient.  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  Ultimately, after some discussion with her, we decided to continue pramipexole, 0.5 mg 3 times per day.  -Last visit, it is my goal to decrease her propranolol to once per day, but she is still on propranolol, 20 mg 3 times per day.  I did not change that today.  She reports that she is only on this for tremor and not for blood pressure control.  I doubt it is helping tremor very much.  -Discussed with her the importance of safe, cardiovascular exercise for the long-term progression of disease.  Follow Up Instructions:  Patient already has a follow-up appointment with me in April.  We can decide then if we want to change any medicines then.  -I discussed the assessment and treatment plan with the patient. The patient was provided an opportunity to ask questions and all were answered. The patient agreed with the plan and demonstrated an understanding of the instructions.   The patient was advised to call back or seek an in-person evaluation if the symptoms worsen or if the condition fails to improve as anticipated.    Total time spent on today's visit was  30 minutes, including both face-to-face time and nonface-to-face time.  Time included that spent on review of records (prior notes available to me/labs/imaging if pertinent), discussing treatment and goals, answering patient's questions and coordinating care.   Kerin Salen, DO

## 2019-03-21 MED FILL — metFORMIN HCL ER 500 MG TB2: 500 | 30 days supply | Qty: 60 | Fill #2

## 2019-03-26 ENCOUNTER — Other Ambulatory Visit (INDEPENDENT_AMBULATORY_CARE_PROVIDER_SITE_OTHER): Payer: Self-pay | Admitting: Primary Care

## 2019-03-26 DIAGNOSIS — E119 Type 2 diabetes mellitus without complications: Secondary | ICD-10-CM

## 2019-03-26 MED FILL — PROPRANOLOL 20 MG TABLET: 20 | 30 days supply | Qty: 90 | Fill #2

## 2019-03-26 MED FILL — PRAMIPEXOLE 0.5 MG TABLET: 0.5 | 30 days supply | Qty: 90 | Fill #3

## 2019-03-26 MED FILL — JANUVIA 100 MG TABLET: 100 | 30 days supply | Qty: 30 | Fill #0

## 2019-03-26 MED FILL — ?LOVASTATIN 40MG TABS: 40 | 30 days supply | Qty: 15 | Fill #10

## 2019-03-26 MED FILL — GLIMEPIRIDE 4 MG TABS: 4 | 30 days supply | Qty: 30 | Fill #1

## 2019-03-26 MED FILL — LISINOPRIL 2.5 MG TABLET: 2.5 | 30 days supply | Qty: 60 | Fill #0

## 2019-04-09 MED FILL — $LANTUS SOLOSTAR 100 UNITS/: 100 | 30 days supply | Qty: 15 | Fill #3

## 2019-04-09 MED FILL — PARoxetine HCL 40 MG TABS: 40 | 30 days supply | Qty: 30 | Fill #0

## 2019-04-16 MED FILL — metFORMIN HCL ER 500 MG TB2: 500 | 30 days supply | Qty: 60 | Fill #3

## 2019-04-30 ENCOUNTER — Other Ambulatory Visit (INDEPENDENT_AMBULATORY_CARE_PROVIDER_SITE_OTHER): Payer: Self-pay | Admitting: Primary Care

## 2019-04-30 MED FILL — GLIMEPIRIDE 4 MG TABS: 4 | 30 days supply | Qty: 30 | Fill #2

## 2019-04-30 MED FILL — ?LOVASTATIN 40MG TABS: 40 | 30 days supply | Qty: 15 | Fill #11

## 2019-04-30 MED FILL — PRAMIPEXOLE 0.5 MG TABLET: 0.5 | 30 days supply | Qty: 90 | Fill #4

## 2019-04-30 MED FILL — JANUVIA 100 MG TABLET: 100 | 30 days supply | Qty: 30 | Fill #1

## 2019-04-30 MED FILL — LISINOPRIL 2.5 MG TABLET: 2.5 | 30 days supply | Qty: 60 | Fill #1

## 2019-04-30 NOTE — Telephone Encounter (Signed)
Sent to PCP ?

## 2019-05-02 MED FILL — PROPRANOLOL 20 MG TABLET: 20 | 30 days supply | Qty: 90 | Fill #0

## 2019-05-07 MED FILL — TRUEPLUS PEN NDL 31G X 1/4": 31G X 6 MM | 25 days supply | Qty: 100 | Fill #2

## 2019-05-07 MED FILL — PARoxetine HCL 40 MG TABS: 40 | 30 days supply | Qty: 30 | Fill #1

## 2019-05-07 MED FILL — $LANTUS SOLOSTAR 100 UNITS/: 100 | 30 days supply | Qty: 15 | Fill #4

## 2019-05-07 MED FILL — TRUEPLUS PEN NDL 31G X 1/4: 31G X 6 MM | 25 days supply | Qty: 100 | Fill #2

## 2019-05-14 MED FILL — metFORMIN HCL ER 500 MG TB2: 500 | 30 days supply | Qty: 60 | Fill #1

## 2019-05-28 MED FILL — LISINOPRIL 2.5 MG TABLET: 2.5 | 30 days supply | Qty: 60 | Fill #2

## 2019-05-28 MED FILL — PRAMIPEXOLE 0.5 MG TABLET: 0.5 | 30 days supply | Qty: 90 | Fill #5

## 2019-05-28 MED FILL — PROPRANOLOL 20 MG TABLET: 20 | 30 days supply | Qty: 90 | Fill #1

## 2019-05-28 MED FILL — GLIMEPIRIDE 4 MG TABS: 4 | 30 days supply | Qty: 30 | Fill #3

## 2019-05-28 MED FILL — JANUVIA 100 MG TABLET: 100 | 30 days supply | Qty: 30 | Fill #2

## 2019-05-31 MED FILL — ?BASAGLAR 100 UNITS/ML KWPE: 100 | 30 days supply | Qty: 15 | Fill #5

## 2019-06-01 ENCOUNTER — Other Ambulatory Visit (INDEPENDENT_AMBULATORY_CARE_PROVIDER_SITE_OTHER): Payer: Self-pay | Admitting: Primary Care

## 2019-06-01 DIAGNOSIS — E119 Type 2 diabetes mellitus without complications: Secondary | ICD-10-CM

## 2019-06-01 DIAGNOSIS — E785 Hyperlipidemia, unspecified: Secondary | ICD-10-CM

## 2019-06-01 MED ORDER — LOVASTATIN 40 MG PO TABS: 20.0000 mg | ORAL_TABLET | Freq: Every day | ORAL | 0 refills | Status: DC

## 2019-06-01 MED FILL — ?LOVASTATIN 40MG TABS: 40 | 30 days supply | Qty: 15 | Fill #0

## 2019-06-04 MED FILL — PARoxetine HCL 40 MG TABS: 40 | 30 days supply | Qty: 30 | Fill #2

## 2019-06-15 NOTE — Progress Notes (Signed)
Assessment/Plan:   1.  Parkinsons Disease  -continue Pramipexole 0.5 mg 3 times per day.  -decrease Propranolol 20 mg tid to 20 mg daily.  Had tried to do that in the past but pt misunderstood and came back on 20 mg tid.  This is RX by NP currently but for tremor.    -We discussed that it used to be thought that levodopa would increase risk of melanoma but now it is believed that Parkinsons itself likely increases risk of melanoma. she is to get regular skin checks.  2.  DM  -Not ideally controlled.  -Following with primary care.  3. Follow up is anticipated in the next 4-6 months, sooner should new neurologic issues arise.   Subjective:   Tricia Ramsey was seen today in follow up for Parkinsons disease.  My previous records were reviewed prior to todays visit as well as outside records available to me.  Patient was started on pramipexole 2 visits ago, but our last visit was via video, so it was difficult for me to tell whether or not it has helped with rigidity (she did not think it helped with tremor).  She states today that she has been doing well in terms of tremor.  She has had no compulsive behaviors.  She has had no sleep attacks.  She does state that she has been sleepy lately, napping perhaps 2 hours/day, but states that she relates that to the recent Covid vaccine.  Pt denies falls.  Pt denies lightheadedness, near syncope.  No hallucinations.  Mood has been good.  Current prescribed movement disorder medications: Pramipexole, 0.5 mg 3 times per day Propranolol 20 mg 3 times per day   PREVIOUS MEDICATIONS: Mirapex  ALLERGIES:  No Known Allergies  CURRENT MEDICATIONS:  Outpatient Encounter Medications as of 06/19/2019  Medication Sig  . glimepiride (AMARYL) 4 MG tablet Take 1 tablet (4 mg total) by mouth daily before breakfast. Needs office visit for more refills.  . Insulin Glargine (LANTUS SOLOSTAR) 100 UNIT/ML Solostar Pen Inject 25 Units into the skin 2 (two) times  daily.  . Insulin Pen Needle (PEN NEEDLES) 31G X 5 MM MISC Inject 1 each into the skin at bedtime.  Marland Kitchen lisinopril (ZESTRIL) 2.5 MG tablet TAKE 2 TABLETS (5 MG TOTAL) BY MOUTH DAILY.  Marland Kitchen lovastatin (MEVACOR) 40 MG tablet Take 0.5 tablets (20 mg total) by mouth at bedtime. Needs labs for further refills  . metFORMIN (GLUCOPHAGE XR) 500 MG 24 hr tablet Take 2 tablets (1,000 mg total) by mouth daily with breakfast.  . PARoxetine (PAXIL) 40 MG tablet Take 1 tablet (40 mg total) by mouth every morning.  . pramipexole (MIRAPEX) 0.5 MG tablet Take 1 tablet (0.5 mg total) by mouth 3 (three) times daily.  . propranolol (INDERAL) 20 MG tablet Take 1.5 tablets (30 mg total) by mouth daily.  . sitaGLIPtin (JANUVIA) 100 MG tablet Take 1 tablet (100 mg total) by mouth daily.  . [DISCONTINUED] pramipexole (MIRAPEX) 0.5 MG tablet Take 1 tablet (0.5 mg total) by mouth 3 (three) times daily.  . [DISCONTINUED] propranolol (INDERAL) 20 MG tablet TAKE 1.5 TABLETS (30 MG TOTAL) BY MOUTH 2 (TWO) TIMES DAILY.  . [DISCONTINUED] pramipexole (MIRAPEX) 0.125 MG tablet 1 tablet three times per day for a week, then 2 tablets three times per day for a week   No facility-administered encounter medications on file as of 06/19/2019.    Objective:   PHYSICAL EXAMINATION:    VITALS:   Vitals:  06/19/19 1025  BP: 120/71  Pulse: 89  Resp: 18  SpO2: 97%  Weight: 209 lb (94.8 kg)  Height: 5\' 1"  (1.549 m)    GEN:  The patient appears stated age and is in NAD. HEENT:  Normocephalic, atraumatic.  The mucous membranes are moist. The superficial temporal arteries are without ropiness or tenderness. CV:  RRR Lungs:  CTAB Neck/HEME:  There are no carotid bruits bilaterally.  Neurological examination:  Orientation: The patient is alert and oriented x3. Cranial nerves: There is good facial symmetry with no significant facial hypomimia. The speech is fluent and clear. Soft palate rises symmetrically and there is no tongue  deviation. Hearing is intact to conversational tone. Sensation: Sensation is intact to light touch throughout Motor: Strength is at least antigravity x4.  Movement examination: Tone: There is very mild tone in the right upper extremity Abnormal movements: Could not see any tremor, but I could feel tremor in her right leg. Coordination:  There is no decremation with RAM's, with any form of RAMS, including alternating supination and pronation of the forearm, hand opening and closing, finger taps, heel taps and toe taps. Gait and Station: The patient has no difficulty arising out of a deep-seated chair without the use of the hands. The patient's stride length is good.   I have reviewed and interpreted the following labs independently   Chemistry      Component Value Date/Time   NA 142 12/19/2018 1409   K 3.7 12/19/2018 1409   CL 105 12/19/2018 1409   CO2 21 12/19/2018 1409   BUN 12 12/19/2018 1409   CREATININE 1.22 (H) 12/19/2018 1409      Component Value Date/Time   CALCIUM 9.2 12/19/2018 1409   ALKPHOS 73 12/19/2018 1409   AST 12 12/19/2018 1409   ALT 12 12/19/2018 1409   BILITOT 0.4 12/19/2018 1409     Lab Results  Component Value Date   HGBA1C 8.6 (A) 12/19/2018     Total time spent on today's visit was 30 minutes, including both face-to-face time and nonface-to-face time.  Time included that spent on review of records (prior notes available to me/labs/imaging if pertinent), discussing treatment and goals, answering patient's questions and coordinating care.  Cc:  Kerin Perna, NP

## 2019-06-19 ENCOUNTER — Other Ambulatory Visit (INDEPENDENT_AMBULATORY_CARE_PROVIDER_SITE_OTHER): Payer: Medicaid Other

## 2019-06-19 ENCOUNTER — Other Ambulatory Visit: Payer: Self-pay

## 2019-06-19 ENCOUNTER — Encounter: Payer: Self-pay | Admitting: Neurology

## 2019-06-19 ENCOUNTER — Ambulatory Visit: Payer: Self-pay | Admitting: Neurology

## 2019-06-19 VITALS — BP 120/71 | HR 89 | Resp 18 | Ht 61.0 in | Wt 209.0 lb

## 2019-06-19 DIAGNOSIS — E119 Type 2 diabetes mellitus without complications: Secondary | ICD-10-CM

## 2019-06-19 DIAGNOSIS — E785 Hyperlipidemia, unspecified: Secondary | ICD-10-CM

## 2019-06-19 DIAGNOSIS — G20 Parkinson's disease: Secondary | ICD-10-CM

## 2019-06-19 MED ORDER — PROPRANOLOL HCL 20 MG PO TABS: 30.0000 mg | ORAL_TABLET | Freq: Every day | ORAL | 1 refills | Status: DC

## 2019-06-19 MED ORDER — PRAMIPEXOLE DIHYDROCHLORIDE 0.5 MG PO TABS: 0.5000 mg | ORAL_TABLET | Freq: Three times a day (TID) | ORAL | 1 refills | Status: DC

## 2019-06-19 MED FILL — PRAMIPEXOLE 0.5 MG TABLET: 0.5 | 30 days supply | Qty: 90 | Fill #0

## 2019-06-19 MED FILL — metFORMIN HCL ER 500 MG TB2: 500 | 30 days supply | Qty: 60 | Fill #2

## 2019-06-19 NOTE — Patient Instructions (Signed)
1.  Decrease propranolol to 20 mg ONCE per day 2.  Continue pramipexole 0.5 mg three times per day

## 2019-06-20 LAB — CMP14+EGFR
ALT: 14 IU/L (ref 0–32)
AST: 12 IU/L (ref 0–40)
Albumin/Globulin Ratio: 1.7 (ref 1.2–2.2)
Albumin: 4.1 g/dL (ref 3.8–4.8)
Alkaline Phosphatase: 58 IU/L (ref 39–117)
BUN/Creatinine Ratio: 8 — ABNORMAL LOW (ref 12–28)
BUN: 10 mg/dL (ref 8–27)
Bilirubin Total: 0.3 mg/dL (ref 0.0–1.2)
CO2: 21 mmol/L (ref 20–29)
Calcium: 9 mg/dL (ref 8.7–10.3)
Chloride: 106 mmol/L (ref 96–106)
Creatinine, Ser: 1.29 mg/dL — ABNORMAL HIGH (ref 0.57–1.00)
GFR calc Af Amer: 52 mL/min/{1.73_m2} — ABNORMAL LOW (ref >59)
GFR calc non Af Amer: 45 mL/min/{1.73_m2} — ABNORMAL LOW (ref >59)
Globulin, Total: 2.4 g/dL (ref 1.5–4.5)
Glucose: 162 mg/dL — ABNORMAL HIGH (ref 65–99)
Potassium: 4 mmol/L (ref 3.5–5.2)
Sodium: 141 mmol/L (ref 134–144)
Total Protein: 6.5 g/dL (ref 6.0–8.5)

## 2019-06-20 LAB — LIPID PANEL
Chol/HDL Ratio: 3.7 ratio (ref 0.0–4.4)
Cholesterol, Total: 159 mg/dL (ref 100–199)
HDL: 43 mg/dL (ref >39)
LDL Chol Calc (NIH): 79 mg/dL (ref 0–99)
Triglycerides: 222 mg/dL — ABNORMAL HIGH (ref 0–149)
VLDL Cholesterol Cal: 37 mg/dL (ref 5–40)

## 2019-06-25 ENCOUNTER — Other Ambulatory Visit (INDEPENDENT_AMBULATORY_CARE_PROVIDER_SITE_OTHER): Payer: Self-pay | Admitting: Primary Care

## 2019-06-25 ENCOUNTER — Telehealth (INDEPENDENT_AMBULATORY_CARE_PROVIDER_SITE_OTHER): Payer: Self-pay

## 2019-06-25 DIAGNOSIS — E119 Type 2 diabetes mellitus without complications: Secondary | ICD-10-CM

## 2019-06-25 DIAGNOSIS — E785 Hyperlipidemia, unspecified: Secondary | ICD-10-CM

## 2019-06-25 DIAGNOSIS — I10 Essential (primary) hypertension: Secondary | ICD-10-CM

## 2019-06-25 MED FILL — LISINOPRIL 2.5 MG TABLET: 2.5 | 30 days supply | Qty: 60 | Fill #3

## 2019-06-25 MED FILL — JANUVIA 100 MG TABLET: 100 | 30 days supply | Qty: 30 | Fill #3

## 2019-06-25 NOTE — Telephone Encounter (Signed)
Sent to PCP ?

## 2019-06-25 NOTE — Telephone Encounter (Signed)
Past due follow up DM have her to come in for fasting labs and A1C

## 2019-06-25 NOTE — Telephone Encounter (Signed)
Patient called to request refill for glimepiride. Pharmacy sent electronic request which was denied per PCP note that patient needed office visit for refills. Patient is scheduled for an appointment in June for Bp. Last A1c was 12/2018. Please refill if appropriate. Patient uses [facility]. Tricia Ramsey

## 2019-06-27 NOTE — Telephone Encounter (Signed)
Patient schedule an appt for April 28 @ 8:30 am.

## 2019-07-04 ENCOUNTER — Ambulatory Visit (INDEPENDENT_AMBULATORY_CARE_PROVIDER_SITE_OTHER): Payer: Self-pay | Admitting: Primary Care

## 2019-07-04 ENCOUNTER — Other Ambulatory Visit: Payer: Self-pay

## 2019-07-04 ENCOUNTER — Other Ambulatory Visit (INDEPENDENT_AMBULATORY_CARE_PROVIDER_SITE_OTHER): Payer: Self-pay | Admitting: Primary Care

## 2019-07-04 ENCOUNTER — Encounter (INDEPENDENT_AMBULATORY_CARE_PROVIDER_SITE_OTHER): Payer: Self-pay | Admitting: Primary Care

## 2019-07-04 VITALS — BP 121/83 | HR 85 | Temp 97.2°F | Ht 61.0 in | Wt 208.4 lb

## 2019-07-04 DIAGNOSIS — Z720 Tobacco use: Secondary | ICD-10-CM

## 2019-07-04 DIAGNOSIS — Z760 Encounter for issue of repeat prescription: Secondary | ICD-10-CM

## 2019-07-04 DIAGNOSIS — Z1231 Encounter for screening mammogram for malignant neoplasm of breast: Secondary | ICD-10-CM

## 2019-07-04 DIAGNOSIS — E119 Type 2 diabetes mellitus without complications: Secondary | ICD-10-CM

## 2019-07-04 LAB — POCT GLYCOSYLATED HEMOGLOBIN (HGB A1C): Hemoglobin A1C: 9 % — AB (ref 4.0–5.6)

## 2019-07-04 LAB — POCT CBG (FASTING - GLUCOSE)-MANUAL ENTRY: Glucose Fasting, POC: 174 mg/dL — AB (ref 70–99)

## 2019-07-04 MED ORDER — GLIMEPIRIDE 4 MG PO TABS: 4.0000 mg | ORAL_TABLET | Freq: Every day | ORAL | 1 refills | Status: DC

## 2019-07-04 MED ORDER — LISINOPRIL 2.5 MG PO TABS: 2.5000 mg | ORAL_TABLET | Freq: Every day | ORAL | 1 refills | Status: DC

## 2019-07-04 MED ORDER — PEN NEEDLES 31G X 5 MM MISC: 1.0000 | Freq: Every day | 3 refills | Status: DC

## 2019-07-04 MED ORDER — LANTUS SOLOSTAR 100 UNIT/ML ~~LOC~~ SOPN: 30.0000 [IU] | PEN_INJECTOR | Freq: Two times a day (BID) | SUBCUTANEOUS | 5 refills | Status: DC

## 2019-07-04 MED ORDER — LOVASTATIN 40 MG PO TABS: 20.0000 mg | ORAL_TABLET | Freq: Every day | ORAL | 1 refills | Status: AC

## 2019-07-04 MED ORDER — SITAGLIPTIN PHOSPHATE 100 MG PO TABS: 100.0000 mg | ORAL_TABLET | Freq: Every day | ORAL | 1 refills | Status: DC

## 2019-07-04 MED ORDER — METFORMIN HCL ER 500 MG PO TB24: 1000.0000 mg | ORAL_TABLET | Freq: Every day | ORAL | 1 refills | Status: DC

## 2019-07-04 MED FILL — LOVASTATIN 40 MG TABS: 40 | 30 days supply | Qty: 15 | Fill #0

## 2019-07-04 MED FILL — GLIMEPIRIDE 4 MG TABS: 4 | 30 days supply | Qty: 30 | Fill #0

## 2019-07-04 MED FILL — ?BASAGLAR 100 UNITS/ML KWPE: 100 | 30 days supply | Qty: 18 | Fill #0

## 2019-07-04 MED FILL — TRUEPLUS 5-BEVEL PEN NEEDLE: 31G X 5 MM | 25 days supply | Qty: 100 | Fill #0

## 2019-07-04 MED FILL — PARoxetine HCL 40 MG TABS: 40 | 30 days supply | Qty: 30 | Fill #3

## 2019-07-04 NOTE — Patient Instructions (Signed)
Diabetes Mellitus and Exercise Exercising regularly is important for your overall health, especially when you have diabetes (diabetes mellitus). Exercising is not only about losing weight. It has many other health benefits, such as increasing muscle strength and bone density and reducing body fat and stress. This leads to improved fitness, flexibility, and endurance, all of which result in better overall health. Exercise has additional benefits for people with diabetes, including:  Reducing appetite.  Helping to lower and control blood glucose.  Lowering blood pressure.  Helping to control amounts of fatty substances (lipids) in the blood, such as cholesterol and triglycerides.  Helping the body to respond better to insulin (improving insulin sensitivity).  Reducing how much insulin the body needs.  Decreasing the risk for heart disease by: ? Lowering cholesterol and triglyceride levels. ? Increasing the levels of good cholesterol. ? Lowering blood glucose levels. What is my activity plan? Your health care provider or certified diabetes educator can help you make a plan for the type and frequency of exercise (activity plan) that works for you. Make sure that you:  Do at least 150 minutes of moderate-intensity or vigorous-intensity exercise each week. This could be brisk walking, biking, or water aerobics. ? Do stretching and strength exercises, such as yoga or weightlifting, at least 2 times a week. ? Spread out your activity over at least 3 days of the week.  Get some form of physical activity every day. ? Do not go more than 2 days in a row without some kind of physical activity. ? Avoid being inactive for more than 30 minutes at a time. Take frequent breaks to walk or stretch.  Choose a type of exercise or activity that you enjoy, and set realistic goals.  Start slowly, and gradually increase the intensity of your exercise over time. What do I need to know about managing my  diabetes?   Check your blood glucose before and after exercising. ? If your blood glucose is 240 mg/dL (13.3 mmol/L) or higher before you exercise, check your urine for ketones. If you have ketones in your urine, do not exercise until your blood glucose returns to normal. ? If your blood glucose is 100 mg/dL (5.6 mmol/L) or lower, eat a snack containing 15-20 grams of carbohydrate. Check your blood glucose 15 minutes after the snack to make sure that your level is above 100 mg/dL (5.6 mmol/L) before you start your exercise.  Know the symptoms of low blood glucose (hypoglycemia) and how to treat it. Your risk for hypoglycemia increases during and after exercise. Common symptoms of hypoglycemia can include: ? Hunger. ? Anxiety. ? Sweating and feeling clammy. ? Confusion. ? Dizziness or feeling light-headed. ? Increased heart rate or palpitations. ? Blurry vision. ? Tingling or numbness around the mouth, lips, or tongue. ? Tremors or shakes. ? Irritability.  Keep a rapid-acting carbohydrate snack available before, during, and after exercise to help prevent or treat hypoglycemia.  Avoid injecting insulin into areas of the body that are going to be exercised. For example, avoid injecting insulin into: ? The arms, when playing tennis. ? The legs, when jogging.  Keep records of your exercise habits. Doing this can help you and your health care provider adjust your diabetes management plan as needed. Write down: ? Food that you eat before and after you exercise. ? Blood glucose levels before and after you exercise. ? The type and amount of exercise you have done. ? When your insulin is expected to peak, if you use   insulin. Avoid exercising at times when your insulin is peaking.  When you start a new exercise or activity, work with your health care provider to make sure the activity is safe for you, and to adjust your insulin, medicines, or food intake as needed.  Drink plenty of water while  you exercise to prevent dehydration or heat stroke. Drink enough fluid to keep your urine clear or pale yellow. Summary  Exercising regularly is important for your overall health, especially when you have diabetes (diabetes mellitus).  Exercising has many health benefits, such as increasing muscle strength and bone density and reducing body fat and stress.  Your health care provider or certified diabetes educator can help you make a plan for the type and frequency of exercise (activity plan) that works for you.  When you start a new exercise or activity, work with your health care provider to make sure the activity is safe for you, and to adjust your insulin, medicines, or food intake as needed. This information is not intended to replace advice given to you by your health care provider. Make sure you discuss any questions you have with your health care provider. Document Revised: 09/16/2016 Document Reviewed: 08/04/2015 Elsevier Patient Education  2020 Elsevier Inc.  

## 2019-07-04 NOTE — Progress Notes (Signed)
Tricia Ramsey is a 64 y.o. female who presents for an follow up evaluation of Type 2 diabetes mellitus.  Current symptoms/problems include none . Symptoms have been present for 0 none.  Current diabetic medications include  Lantus 25 units twice daily Amaryl 4 mg daily Januvia 100 mg daily Metformin 500 mg XR 2 tablets in the morning and 2 tablets in the evening total of 1000 mg every morning and every afternoon   The patient was initially diagnosed with Type 2 diabetes mellitus based on the following criteria:  ADA A1C greater than 6.4 (9.2)  Current monitoring regimen: home blood tests - 2 times weekly Home blood sugar records: fasting range: 120-160 Any episodes of hypoglycemia?no Known diabetic complications: cardiovascular disease Cardiovascular risk factors: advanced age (older than 49 for men, 22 for women), diabetes mellitus, dyslipidemia, obesity (BMI >= 30 kg/m2) and smoking/ tobacco exposure Eye exam current (within one year): no Weight trend: stable Prior visit with CDE: no Current diet: in general, an "unhealthy" diet Current exercise: walking Medication Compliance?  No   Is She on ACE inhibitor lisinopril (Zestril)   Review of Systems  Neurological: Positive for tremors.  All other systems reviewed and are negative.   Objective:    BP 121/83 (BP Location: Right Arm, Patient Position: Sitting, Cuff Size: Large)   Pulse 85   Temp (!) 97.2 F (36.2 C) (Temporal)   Ht 5\' 1"  (1.549 m)   Wt 208 lb 6.4 oz (94.5 kg)   SpO2 98%   BMI 39.38 kg/m   Physical Exam Constitutional:      Appearance: Normal appearance. She is obese.  Cardiovascular:     Rate and Rhythm: Normal rate and regular rhythm.  Pulmonary:     Effort: Pulmonary effort is normal.     Breath sounds: Normal breath sounds.  Abdominal:     General: Bowel sounds are normal.  Musculoskeletal:        General: Normal range of motion.     Cervical back: Normal range of motion and neck supple.   Skin:    General: Skin is warm and dry.  Neurological:     Mental Status: She is alert and oriented to person, place, and time.     Comments: Tremors   Psychiatric:        Mood and Affect: Mood normal.        Behavior: Behavior normal.        Thought Content: Thought content normal.        Judgment: Judgment normal.      Lab Review Glucose (mg/dL)  Date Value  06/19/2019 162 (H)  12/19/2018 185 (H)  10/02/2018 199 (H)   Glucose, Bld (mg/dL)  Date Value  06/15/2017 882 (HH)  06/30/2016 726 (HH)  09/03/2009 123 (H)   CO2 (mmol/L)  Date Value  06/19/2019 21  12/19/2018 21  10/02/2018 21   BUN (mg/dL)  Date Value  06/19/2019 10  12/19/2018 12  10/02/2018 9   Creatinine, Ser (mg/dL)  Date Value  06/19/2019 1.29 (H)  12/19/2018 1.22 (H)  10/02/2018 1.02 (H)      Assessment:    Diabetes Mellitus type II, under poor control.   Dicie was seen today for diabetes and medication refill.  Diagnoses and all orders for this visit:     Plan:    Type 2 diabetes mellitus without complication, without long-term current use of insulin (HCC) -     HgB A1c 9.0 previously 6 months  ago 8.6 -     Glucose (CBG), Fasting Decrease foods that are high in carbohydrates are the following rice, potatoes, breads, sugars, and pastas.  Reduction in the intake (eating) will assist in lowering your blood sugars.  1.  Rx changes: increase lantus to 30 units in the morning and evening  Continue Amaryl 4 mg daily Metformin 500 XR milligrams 2 in the morning and 2 in the evening total of 1000 mg twice a day, Januvia 100 mg daily 2.  Education: Reviewed 'ABCs' of diabetes management (respective goals in parentheses):  A1C (<7), blood pressure (<130/80), and cholesterol (LDL <100). 3. Discussed pathophysiology of DM;  4. CHO counting diet discussed.  Reviewed CHO amount in various foods and how to read nutrition labels.  Discussed recommended serving sizes.  5.  Recommend check BG 3 times a  day 6.  Recommended increase physical activity - goal is 150 minutes per week 7. Follow up: 3 month    Encounter for screening mammogram for malignant neoplasm of breast Patient completed application for BCCP while in clinic and application has and faxed to [facility]. Patient aware that [facility] will contact her directly to schedule appointment.  Tobacco abuse She is aware of the increased risk for lung cancer and other respiratory diseases recommend cessation. She is thinking about quitting this will be reminded at each clinical visit.  Other orders/Medication refills -     glimepiride (AMARYL) 4 MG tablet; Take 1 tablet (4 mg total) by mouth daily before breakfast. Needs office visit for more refills. -     lisinopril (ZESTRIL) 2.5 MG tablet; Take 1 tablet (2.5 mg total) by mouth daily. -     insulin glargine (LANTUS SOLOSTAR) 100 UNIT/ML Solostar Pen; Inject 30 Units into the skin 2 (two) times daily. -     lovastatin (MEVACOR) 40 MG tablet; Take 0.5 tablets (20 mg total) by mouth at bedtime. Needs labs for further refills -     sitaGLIPtin (JANUVIA) 100 MG tablet; Take 1 tablet (100 mg total) by mouth daily. -     metFORMIN (GLUCOPHAGE XR) 500 MG 24 hr tablet; Take 2 tablets (1,000 mg total) by mouth daily with breakfast. -     Insulin Pen Needle (PEN NEEDLES) 31G X 5 MM MISC; Inject 1 each into the skin at bedtime.

## 2019-07-23 MED FILL — LISINOPRIL 2.5 MG TABLET: 2.5 | 30 days supply | Qty: 30 | Fill #0

## 2019-07-23 MED FILL — metFORMIN HCL ER 500 MG TB2: 500 | 30 days supply | Qty: 60 | Fill #3

## 2019-07-30 ENCOUNTER — Other Ambulatory Visit (INDEPENDENT_AMBULATORY_CARE_PROVIDER_SITE_OTHER): Payer: Self-pay | Admitting: Primary Care

## 2019-07-30 MED FILL — LOVASTATIN 40 MG TABS: 40 | 30 days supply | Qty: 15 | Fill #1

## 2019-07-30 MED FILL — PRAMIPEXOLE 0.5 MG TABLET: 0.5 | 30 days supply | Qty: 90 | Fill #1

## 2019-07-30 MED FILL — GLIMEPIRIDE 4 MG TABS: 4 | 30 days supply | Qty: 30 | Fill #1

## 2019-07-30 MED FILL — JANUVIA 100 MG TABLET: 100 | 30 days supply | Qty: 30 | Fill #0

## 2019-08-03 MED FILL — ?BASAGLAR 100 UNITS/ML KWPE: 100 | 30 days supply | Qty: 18 | Fill #1

## 2019-08-08 MED FILL — PARoxetine HCL 40 MG TABS: 40 | 30 days supply | Qty: 30 | Fill #0

## 2019-08-09 ENCOUNTER — Other Ambulatory Visit: Payer: Self-pay | Admitting: Neurology

## 2019-08-09 MED ORDER — PROPRANOLOL HCL 20 MG PO TABS: 20.0000 mg | ORAL_TABLET | Freq: Every day | ORAL | 1 refills | Status: DC

## 2019-08-09 MED FILL — ?PROPRANOLOL 20 MG TABLET: 20 | 30 days supply | Qty: 30 | Fill #0

## 2019-08-09 NOTE — Telephone Encounter (Addendum)
Rx(s) sent to pharmacy electronically.  Patient notified and voiced understanding.  

## 2019-08-09 NOTE — Telephone Encounter (Signed)
Patient needs refill of Propranolol 20mg  sent to [facility]. States that the pharmacy has been having a difficult time getting a refill request sent back so she was told to call.

## 2019-08-20 MED FILL — metFORMIN HCL ER 500 MG TB2: 500 | 30 days supply | Qty: 60 | Fill #0

## 2019-08-20 MED FILL — LISINOPRIL 2.5 MG TABLET: 2.5 | 30 days supply | Qty: 30 | Fill #1

## 2019-08-20 MED FILL — TRUEPLUS PEN NDL 31G X 1/4: 31G X 6 MM | 25 days supply | Qty: 100 | Fill #3

## 2019-08-27 MED FILL — JANUVIA 100 MG TABLET: 100 | 30 days supply | Qty: 30 | Fill #1

## 2019-08-27 MED FILL — PRAMIPEXOLE 0.5 MG TABLET: 0.5 | 30 days supply | Qty: 90 | Fill #2

## 2019-08-27 MED FILL — LOVASTATIN 40 MG TABS: 40 | 30 days supply | Qty: 15 | Fill #2

## 2019-08-27 MED FILL — ?PROPRANOLOL 20 MG TABLET: 20 | 30 days supply | Qty: 30 | Fill #1

## 2019-08-27 MED FILL — GLIMEPIRIDE 4 MG TABS: 4 | 30 days supply | Qty: 30 | Fill #2

## 2019-08-29 ENCOUNTER — Other Ambulatory Visit: Payer: Self-pay

## 2019-08-29 DIAGNOSIS — Z1231 Encounter for screening mammogram for malignant neoplasm of breast: Secondary | ICD-10-CM

## 2019-09-03 ENCOUNTER — Ambulatory Visit (INDEPENDENT_AMBULATORY_CARE_PROVIDER_SITE_OTHER): Payer: Medicaid Other | Admitting: Primary Care

## 2019-09-03 MED FILL — PARoxetine HCL 40 MG TABS: 40 | 30 days supply | Qty: 30 | Fill #1

## 2019-09-12 ENCOUNTER — Other Ambulatory Visit (INDEPENDENT_AMBULATORY_CARE_PROVIDER_SITE_OTHER): Payer: Self-pay | Admitting: Primary Care

## 2019-09-12 DIAGNOSIS — E119 Type 2 diabetes mellitus without complications: Secondary | ICD-10-CM

## 2019-09-12 MED FILL — $LANTUS SOLOSTAR 100 UNITS/: 100 | 75 days supply | Qty: 45 | Fill #0

## 2019-09-17 MED FILL — LISINOPRIL 2.5 MG TABLET: 2.5 | 30 days supply | Qty: 30 | Fill #2

## 2019-09-17 MED FILL — metFORMIN HCL ER 500 MG TB2: 500 | 30 days supply | Qty: 60 | Fill #1

## 2019-10-01 ENCOUNTER — Other Ambulatory Visit (INDEPENDENT_AMBULATORY_CARE_PROVIDER_SITE_OTHER): Payer: Self-pay | Admitting: Primary Care

## 2019-10-01 DIAGNOSIS — F418 Other specified anxiety disorders: Secondary | ICD-10-CM

## 2019-10-01 MED FILL — PARoxetine HCL 40 MG TABS: 40 | 30 days supply | Qty: 30 | Fill #0

## 2019-10-01 MED FILL — LOVASTATIN 40 MG TABS: 40 | 30 days supply | Qty: 15 | Fill #3

## 2019-10-01 MED FILL — ?PROPRANOLOL 20 MG TABLET: 20 | 30 days supply | Qty: 30 | Fill #2

## 2019-10-01 MED FILL — GLIMEPIRIDE 4 MG TABS: 4 | 30 days supply | Qty: 30 | Fill #3

## 2019-10-01 MED FILL — PRAMIPEXOLE 0.5 MG TABLET: 0.5 | 30 days supply | Qty: 90 | Fill #3

## 2019-10-01 NOTE — Telephone Encounter (Signed)
Requested Prescriptions  Pending Prescriptions Disp Refills  . PARoxetine (PAXIL) 40 MG tablet [Pharmacy Med Name: PARoxetine HCL 40 MG TABS 40 Tablet] 90 tablet 0    Sig: TAKE 1 TABLET (40 MG TOTAL) BY MOUTH EVERY MORNING.     Psychiatry:  Antidepressants - SSRI Passed - 10/01/2019 10:09 AM      Passed - Valid encounter within last 6 months    Recent Outpatient Visits          2 months ago Type 2 diabetes mellitus without complication, without long-term current use of insulin (HCC)   [facility] Grayce Sessions, NP   7 months ago Essential hypertension   [facility] Gwinda Passe P, NP   8 months ago Essential hypertension   [facility] Gwinda Passe P, NP   9 months ago Type 2 diabetes mellitus without complication, without long-term current use of insulin (HCC)   [facility] Grayce Sessions, NP   1 year ago Type 2 diabetes mellitus without complication, without long-term current use of insulin (HCC)   [facility] Grayce Sessions, NP      Future Appointments            In 2 days Grayce Sessions, NP [facility]

## 2019-10-02 ENCOUNTER — Other Ambulatory Visit: Payer: Self-pay

## 2019-10-02 ENCOUNTER — Ambulatory Visit: Payer: Medicaid Other

## 2019-10-02 ENCOUNTER — Ambulatory Visit
Admission: RE | Admit: 2019-10-02 | Discharge: 2019-10-02 | Disposition: A | Discharge status: Home / Self Care | Payer: No Typology Code available for payment source | Source: Ambulatory Visit | Attending: Obstetrics and Gynecology | Admitting: Obstetrics and Gynecology

## 2019-10-02 ENCOUNTER — Ambulatory Visit: Payer: Self-pay | Admitting: *Deleted

## 2019-10-02 VITALS — BP 116/78 | Temp 96.9°F | Wt 211.7 lb

## 2019-10-02 DIAGNOSIS — Z1239 Encounter for other screening for malignant neoplasm of breast: Secondary | ICD-10-CM

## 2019-10-02 DIAGNOSIS — Z1231 Encounter for screening mammogram for malignant neoplasm of breast: Secondary | ICD-10-CM

## 2019-10-02 NOTE — Progress Notes (Signed)
Tricia Ramsey is a 64 y.o. female who presents to [facility] clinic today with no complaints.    Pap Smear: Pap smear not completed today. Last Pap smear was 20 years ago per patient and was abnormal. Per patient has history of multiple abnormal Pap smears in which she also received cryotherapy multiple times. Patient states she decided to get a hysterectomy as a result. Last Pap smear result is not available in Epic.   Physical exam: Breasts Breasts symmetrical. No skin abnormalities bilateral breasts. No nipple retraction bilateral breasts. No nipple discharge bilateral breasts. No lymphadenopathy. No lumps palpated bilateral breasts. No complaints of pain or tenderness on exam.      Pelvic/Bimanual Pap is not indicated today.    Smoking History: Patient is a current smoker. She was referred to the [facility] - Eat Quit line.    Patient Navigation: Patient education provided. Access to services provided for patient through BCCCP program.   Colorectal Cancer Screening: Per patient has not received colorectal cancer screening. She was informed about upcoming colorectal cancer screenings through the [facility] program and is interested. No complaints today.    Breast and Cervical Cancer Risk Assessment: Patient has family history of breast cancer in her maternal grandmother. No known genetic mutations, or radiation treatment to the chest before age 37. Per patient has no history of cervical dysplasia, immunocompromised, or DES exposure in-utero.  Risk Assessment    Risk Scores      10/02/2019   Last edited by: Priscille Heidelberg, RN   5-year risk: 1.2 %   Lifetime risk: 5.7 %          A: BCCCP exam without pap smear No complaints today.  P: Referred patient to the [facility] for a screening mammogram. Appointment scheduled October 02, 2019 at 3:40pm.  Tricia Homer, RN, FNP student 10/02/2019 2:46 PM   Attestation of Supervision of Student:  I confirm that I have verified  the information documented in the nurse practitioner student's note and that I have also personally reperformed the history, physical exam and all medical decision making activities.  I have verified that all services and findings are accurately documented in this student's note; and I agree with management and plan as outlined in the documentation. I have also made any necessary editorial changes.  Tricia Ramsey, Tricia Maser, RN [facility] [employer], [facility] 10/02/2019 4:12 PM

## 2019-10-02 NOTE — Patient Instructions (Addendum)
Informed Tricia Ramsey about breast self awareness. Patient did not need a Pap smear today due to having a hysterectomy for benign reasons. Let patient know that she doesn't need any further Pap smears due to her history of a hysterectomy for benign reasons. Referred patient to the [facility] for screening mammogram on the mobile unit. Appointment scheduled for October 02, 2019 at 3:40pm. Patient escorted to mobile unit following BCCCP appointment. Let patient know the Breast Center will follow up with her within the next couple weeks with results of her mammogram by letter or phone. Brayton Layman verbalized understanding.  [name], RN, FNP student 2:56 PM

## 2019-10-03 ENCOUNTER — Encounter (INDEPENDENT_AMBULATORY_CARE_PROVIDER_SITE_OTHER): Payer: Self-pay | Admitting: Primary Care

## 2019-10-03 ENCOUNTER — Ambulatory Visit (INDEPENDENT_AMBULATORY_CARE_PROVIDER_SITE_OTHER): Payer: Self-pay | Admitting: Primary Care

## 2019-10-03 ENCOUNTER — Other Ambulatory Visit: Payer: Self-pay

## 2019-10-03 ENCOUNTER — Other Ambulatory Visit: Payer: Self-pay | Admitting: Pharmacist

## 2019-10-03 VITALS — BP 113/72 | HR 80 | Temp 98.4°F | Ht 61.0 in | Wt 211.4 lb

## 2019-10-03 DIAGNOSIS — E785 Hyperlipidemia, unspecified: Secondary | ICD-10-CM

## 2019-10-03 DIAGNOSIS — E119 Type 2 diabetes mellitus without complications: Secondary | ICD-10-CM

## 2019-10-03 LAB — POCT GLYCOSYLATED HEMOGLOBIN (HGB A1C): Hemoglobin A1C: 8.6 % — AB (ref 4.0–5.6)

## 2019-10-03 LAB — POCT CBG (FASTING - GLUCOSE)-MANUAL ENTRY: Glucose Fasting, POC: 133 mg/dL — AB (ref 70–99)

## 2019-10-03 MED ORDER — METFORMIN HCL ER (MOD) 1000 MG PO TB24: 1000.0000 mg | ORAL_TABLET | Freq: Two times a day (BID) | ORAL | 1 refills | Status: DC

## 2019-10-03 MED ORDER — METFORMIN HCL ER 500 MG PO TB24: 1000.0000 mg | ORAL_TABLET | Freq: Two times a day (BID) | ORAL | 2 refills | Status: DC

## 2019-10-03 MED ORDER — BUPROPION HCL ER (SR) 150 MG PO TB12: 150.0000 mg | ORAL_TABLET | Freq: Two times a day (BID) | ORAL | 1 refills | Status: DC

## 2019-10-03 MED FILL — metFORMIN HCL ER 500 MG TB2: 500 | 30 days supply | Qty: 120 | Fill #0

## 2019-10-03 MED FILL — BUPROPION SR 150 MG TABLET: 150 | 90 days supply | Qty: 180 | Fill #0

## 2019-10-03 NOTE — Patient Instructions (Signed)
Bupropion sustained-release tablets (smoking cessation) What is this medicine? BUPROPION (byoo PROE pee on) is used to help people quit smoking. This medicine may be used for other purposes; ask your health care provider or pharmacist if you have questions. COMMON BRAND NAME(S): Buproban, Zyban What should I tell my health care provider before I take this medicine? They need to know if you have any of these conditions:  an eating disorder, such as anorexia or bulimia  bipolar disorder or psychosis  diabetes or high blood sugar, treated with medication  glaucoma  head injury or brain tumor  heart disease, previous heart attack, or irregular heart beat  high blood pressure  kidney or liver disease  seizures  suicidal thoughts or a previous suicide attempt  Tourette's syndrome  weight loss  an unusual or allergic reaction to bupropion, other medicines, foods, dyes, or preservatives  breast-feeding  pregnant or trying to become pregnant How should I use this medicine? Take this medicine by mouth with a glass of water. Follow the directions on the prescription label. You can take it with or without food. If it upsets your stomach, take it with food. Do not cut, crush or chew this medicine. Take your medicine at regular intervals. If you take this medicine more than once a day, take your second dose at least 8 hours after you take your first dose. To limit difficulty in sleeping, avoid taking this medicine at bedtime. Do not take your medicine more often than directed. Do not stop taking this medicine suddenly except upon the advice of your doctor. Stopping this medicine too quickly may cause serious side effects. A special MedGuide will be given to you by the pharmacist with each prescription and refill. Be sure to read this information carefully each time. Talk to your pediatrician regarding the use of this medicine in children. Special care may be needed. Overdosage: If you  think you have taken too much of this medicine contact a poison control center or emergency room at once. NOTE: This medicine is only for you. Do not share this medicine with others. What if I miss a dose? If you miss a dose, skip the missed dose and take your next tablet at the regular time. There should be at least 8 hours between doses. Do not take double or extra doses. What may interact with this medicine? Do not take this medicine with any of the following medications:  linezolid  MAOIs like Azilect, Carbex, Eldepryl, Marplan, Nardil, and Parnate  methylene blue (injected into a vein)  other medicines that contain bupropion like Wellbutrin This medicine may also interact with the following medications:  alcohol  certain medicines for anxiety or sleep  certain medicines for blood pressure like metoprolol, propranolol  certain medicines for depression or psychotic disturbances  certain medicines for HIV or AIDS like efavirenz, lopinavir, nelfinavir, ritonavir  certain medicines for irregular heart beat like propafenone, flecainide  certain medicines for Parkinson's disease like amantadine, levodopa  certain medicines for seizures like carbamazepine, phenytoin, phenobarbital  cimetidine  clopidogrel  cyclophosphamide  digoxin  furazolidone  isoniazid  nicotine  orphenadrine  procarbazine  steroid medicines like prednisone or cortisone  stimulant medicines for attention disorders, weight loss, or to stay awake  tamoxifen  theophylline  thiotepa  ticlopidine  tramadol  warfarin This list may not describe all possible interactions. Give your health care provider a list of all the medicines, herbs, non-prescription drugs, or dietary supplements you use. Also tell them if you smoke,   drink alcohol, or use illegal drugs. Some items may interact with your medicine. What should I watch for while using this medicine? Visit your doctor or healthcare provider  for regular checks on your progress. This medicine should be used together with a patient support program. It is important to participate in a behavioral program, counseling, or other support program that is recommended by your healthcare provider. This medicine may cause serious skin reactions. They can happen weeks to months after starting the medicine. Contact your healthcare provider right away if you notice fevers or flu-like symptoms with a rash. The rash may be red or purple and then turn into blisters or peeling of the skin. Or, you might notice a red rash with swelling of the face, lips or lymph nodes in your neck or under your arms. Patients and their families should watch out for new or worsening thoughts of suicide or depression. Also watch out for sudden changes in feelings such as feeling anxious, agitated, panicky, irritable, hostile, aggressive, impulsive, severely restless, overly excited and hyperactive, or not being able to sleep. If this happens, especially at the beginning of treatment or after a change in dose, call your healthcare provider. Avoid alcoholic drinks while taking this medicine. Drinking excessive alcoholic beverages, using sleeping or anxiety medicines, or quickly stopping the use of these agents while taking this medicine may increase your risk for a seizure. Do not drive or use heavy machinery until you know how this medicine affects you. This medicine can impair your ability to perform these tasks. Do not take this medicine close to bedtime. It may prevent you from sleeping. Your mouth may get dry. Chewing sugarless gum or sucking hard candy, and drinking plenty of water may help. Contact your doctor if the problem does not go away or is severe. Do not use nicotine patches or chewing gum without the advice of your doctor or healthcare provider while taking this medicine. You may need to have your blood pressure taken regularly if your doctor recommends that you use both  nicotine and this medicine together. What side effects may I notice from receiving this medicine? Side effects that you should report to your doctor or health care professional as soon as possible:  allergic reactions like skin rash, itching or hives, swelling of the face, lips, or tongue  breathing problems  changes in vision  confusion  elevated mood, decreased need for sleep, racing thoughts, impulsive behavior  fast or irregular heartbeat  hallucinations, loss of contact with reality  increased blood pressure  rash, fever, and swollen lymph nodes  redness, blistering, peeling, or loosening of the skin, including inside the mouth  seizures  suicidal thoughts or other mood changes  unusually weak or tired  vomiting Side effects that usually do not require medical attention (report to your doctor or health care professional if they continue or are bothersome):  constipation  headache  loss of appetite  nausea  tremors  weight loss This list may not describe all possible side effects. Call your doctor for medical advice about side effects. You may report side effects to FDA at 1-800-FDA-1088. Where should I keep my medicine? Keep out of the reach of children. Store at room temperature between 20 and 25 degrees C (68 and 77 degrees F). Protect from light. Keep container tightly closed. Throw away any unused medicine after the expiration date. NOTE: This sheet is a summary. It may not cover all possible information. If you have questions about this medicine,   talk to your doctor, pharmacist, or health care provider.  2020 Elsevier/Gold Standard (2018-05-18 13:59:09)  

## 2019-10-03 NOTE — Progress Notes (Signed)
[facility]   Tricia Ramsey is a 64 y.o. female who presents for an  follow-up evaluation of Type 2 diabetes mellitus.  Current symptoms/problems include loss appetite  and have been unchanged. Symptoms have been present for 1 month.  Current diabetic medications include dual therapy to include oral and insulin  Metformin 500 XR 2 tablets at breakfast, glimepiride 4 mg, Januvia 100 mg daily and Basaglar 30 units twice daily  The patient was initially diagnosed with Type 2 diabetes mellitus based on the following criteria:  ADA 8.6 A1C- measurement   Current monitoring regimen: home blood tests - 2 times week Home blood sugar records: fasting range: 160-300 depends on what she eats Any episodes of hypoglycemia? no  Known diabetic complications: none Cardiovascular risk factors: none Eye exam current (within one year): no Weight trend: stable Prior visit with CDE: no Current diet: in general, a "healthy" diet   Current exercise: walking Medication Compliance?  Yes   Is She on ACE inhibitor or angiotensin II receptor blocker?  Yes  lisinopril (Zestril)   The following portions of the patient's history were reviewed and updated as appropriate: allergies, current medications, past family history, past medical history, past social history, past surgical history and problem list.  Review of Systems  All other systems reviewed and are negative.   Objective:    BP 113/72 (BP Location: Left Arm, Patient Position: Sitting, Cuff Size: Normal)   Pulse 80   Temp 98.4 F (36.9 C) (Oral)   Ht 5\' 1"  (1.549 m)   Wt (!) 211 lb 6.4 oz (95.9 kg)   SpO2 96%   BMI 39.94 kg/m   Physical Exam Vitals reviewed.  Constitutional:      Appearance: She is obese.  HENT:     Head: Normocephalic.     Right Ear: Tympanic membrane normal.     Left Ear: Tympanic membrane normal.  Eyes:     Extraocular Movements: Extraocular movements intact.     Pupils: Pupils are equal, round,  and reactive to light.  Cardiovascular:     Rate and Rhythm: Normal rate and regular rhythm.  Pulmonary:     Effort: Pulmonary effort is normal.     Breath sounds: Normal breath sounds.  Abdominal:     General: Bowel sounds are normal.  Musculoskeletal:        General: Normal range of motion.     Cervical back: Normal range of motion.  Skin:    General: Skin is warm and dry.  Neurological:     Mental Status: She is alert and oriented to person, place, and time.  Psychiatric:        Mood and Affect: Mood normal.        Thought Content: Thought content normal.        Judgment: Judgment normal.      Lab Review Glucose (mg/dL)  Date Value  162 (H)  12/19/2018 185 (H)  10/02/2018 199 (H)   Glucose, Bld (mg/dL)  Date Value  10/04/2018 882 (HH)  06/30/2016 726 (HH)  09/03/2009 123 (H)   CO2 (mmol/L)  Date Value  06/19/2019 21  12/19/2018 21  10/02/2018 21   BUN (mg/dL)  Date Value  10/04/2018 10  12/19/2018 12  10/02/2018 9   Creatinine, Ser (mg/dL)  Date Value  10/04/2018 1.29 (H)  12/19/2018 1.22 (H)  10/02/2018 1.02 (H)      Assessment:    Diabetes Mellitus type II, under fair control.  Tricia Ramsey was seen today for diabetes.  Diagnoses and all orders for this visit:  Type 2 diabetes mellitus without complication, without long-term current use of insulin (HCC) -     HgB A1c  8.6 previously 9.0 we are moving in the right direction tighter control on diet. Foods that are high in carbohydrates are the following rice, potatoes, breads, sugars, and pastas.  Reduction in the intake (eating) will assist in lowering your blood sugars. -     Glucose (CBG), Fasting  Hyperlipidemia, unspecified hyperlipidemia type Continue lovastatin 20 mg of lovastatin at bedtime. Will obtain lipid panel today patient is fasting. Plan:    1.  Rx changes: none 2.  Education: Reviewed 'ABCs' of diabetes management (respective goals in parentheses):  A1C (<7), blood  pressure (<130/80), and cholesterol (LDL <100). 3. Discussed pathophysiology of DM; difference between type 1 and type 2 DM. 4. CHO counting diet discussed.  Reviewed CHO amount in various foods and how to read nutrition labels.  Discussed recommended serving sizes.  5.  Recommend check BG 0  times a day 6.  Recommended increase physical activity - goal is 150 minutes per week 7. Follow up: 3 months

## 2019-10-04 LAB — CMP14+EGFR
ALT: 16 IU/L (ref 0–32)
AST: 13 IU/L (ref 0–40)
Albumin/Globulin Ratio: 1.4 (ref 1.2–2.2)
Albumin: 4.1 g/dL (ref 3.8–4.8)
Alkaline Phosphatase: 80 IU/L (ref 48–121)
BUN/Creatinine Ratio: 11 — ABNORMAL LOW (ref 12–28)
BUN: 14 mg/dL (ref 8–27)
Bilirubin Total: 0.5 mg/dL (ref 0.0–1.2)
CO2: 21 mmol/L (ref 20–29)
Calcium: 9.5 mg/dL (ref 8.7–10.3)
Chloride: 104 mmol/L (ref 96–106)
Creatinine, Ser: 1.28 mg/dL — ABNORMAL HIGH (ref 0.57–1.00)
GFR calc Af Amer: 52 mL/min/{1.73_m2} — ABNORMAL LOW (ref >59)
GFR calc non Af Amer: 45 mL/min/{1.73_m2} — ABNORMAL LOW (ref >59)
Globulin, Total: 3 g/dL (ref 1.5–4.5)
Glucose: 111 mg/dL — ABNORMAL HIGH (ref 65–99)
Potassium: 4.2 mmol/L (ref 3.5–5.2)
Sodium: 140 mmol/L (ref 134–144)
Total Protein: 7.1 g/dL (ref 6.0–8.5)

## 2019-10-04 LAB — CBC WITH DIFFERENTIAL/PLATELET
Basophils Absolute: 0.1 10*3/uL (ref 0.0–0.2)
Basos: 1 %
EOS (ABSOLUTE): 0.1 10*3/uL (ref 0.0–0.4)
Eos: 1 %
Hematocrit: 41.1 % (ref 34.0–46.6)
Hemoglobin: 14.2 g/dL (ref 11.1–15.9)
Immature Grans (Abs): 0.1 10*3/uL (ref 0.0–0.1)
Immature Granulocytes: 1 %
Lymphocytes Absolute: 2.1 10*3/uL (ref 0.7–3.1)
Lymphs: 20 %
MCH: 31.2 pg (ref 26.6–33.0)
MCHC: 34.5 g/dL (ref 31.5–35.7)
MCV: 90 fL (ref 79–97)
Monocytes Absolute: 0.6 10*3/uL (ref 0.1–0.9)
Monocytes: 6 %
Neutrophils Absolute: 7.7 10*3/uL — ABNORMAL HIGH (ref 1.4–7.0)
Neutrophils: 71 %
Platelets: 251 10*3/uL (ref 150–450)
RBC: 4.55 x10E6/uL (ref 3.77–5.28)
RDW: 12.8 % (ref 11.7–15.4)
WBC: 10.7 10*3/uL (ref 3.4–10.8)

## 2019-10-04 LAB — LIPID PANEL
Chol/HDL Ratio: 4 ratio (ref 0.0–4.4)
Cholesterol, Total: 165 mg/dL (ref 100–199)
HDL: 41 mg/dL (ref >39)
LDL Chol Calc (NIH): 87 mg/dL (ref 0–99)
Triglycerides: 217 mg/dL — ABNORMAL HIGH (ref 0–149)
VLDL Cholesterol Cal: 37 mg/dL (ref 5–40)

## 2019-10-04 MED FILL — JANUVIA 100 MG TABLET: 100 | 30 days supply | Qty: 30 | Fill #2

## 2019-10-12 MED FILL — LISINOPRIL 2.5 MG TABLET: 2.5 | 30 days supply | Qty: 30 | Fill #3

## 2019-10-24 ENCOUNTER — Telehealth (INDEPENDENT_AMBULATORY_CARE_PROVIDER_SITE_OTHER): Payer: Self-pay | Admitting: Primary Care

## 2019-10-24 DIAGNOSIS — E119 Type 2 diabetes mellitus without complications: Secondary | ICD-10-CM

## 2019-10-25 ENCOUNTER — Other Ambulatory Visit (INDEPENDENT_AMBULATORY_CARE_PROVIDER_SITE_OTHER): Payer: Self-pay | Admitting: Primary Care

## 2019-10-25 DIAGNOSIS — E119 Type 2 diabetes mellitus without complications: Secondary | ICD-10-CM

## 2019-10-25 MED ORDER — PEN NEEDLES 31G X 5 MM MISC: 1.0000 | Freq: Every day | 3 refills | Status: AC

## 2019-10-25 MED FILL — TRUEPLUS 5-BEVEL PEN NEEDLE: 31G X 5 MM | 25 days supply | Qty: 100 | Fill #0

## 2019-10-25 NOTE — Telephone Encounter (Signed)
Pt called and is requesting to have this pen needle refilled. Pt states that she would like a call back to discuss why this has been denied. Please advise.

## 2019-10-25 NOTE — Telephone Encounter (Signed)
Spoke patient unknown why insulin pens denied advised to call insurance

## 2019-10-29 MED FILL — PROPRANOLOL 20 MG TABLET: 20 | 30 days supply | Qty: 30 | Fill #3

## 2019-10-29 MED FILL — GLIMEPIRIDE 4 MG TABS: 4 | 30 days supply | Qty: 30 | Fill #4

## 2019-10-29 MED FILL — JANUVIA 100 MG TABLET: 100 | 30 days supply | Qty: 30 | Fill #3

## 2019-10-29 MED FILL — LOVASTATIN 40 MG TABS: 40 | 30 days supply | Qty: 15 | Fill #4

## 2019-11-05 MED FILL — PARoxetine HCL 40 MG TABS: 40 | 30 days supply | Qty: 30 | Fill #1

## 2019-11-05 MED FILL — PRAMIPEXOLE 0.5 MG TABLET: 0.5 | 30 days supply | Qty: 90 | Fill #4

## 2019-11-06 MED FILL — LISINOPRIL 2.5 MG TABLET: 2.5 | 30 days supply | Qty: 30 | Fill #4

## 2019-11-20 MED FILL — $LANTUS SOLOSTAR 100 UNITS/: 100 | 75 days supply | Qty: 45 | Fill #1

## 2019-12-04 MED FILL — PRAMIPEXOLE 0.5 MG TABLET: 0.5 | 30 days supply | Qty: 90 | Fill #5

## 2019-12-04 NOTE — Progress Notes (Signed)
Virtual Visit Via Video   The purpose of this virtual visit is to provide medical care while limiting exposure to the novel coronavirus.    Consent was obtained for video visit:  Yes.   Answered questions that patient had about telehealth interaction:  Yes.   I discussed the limitations, risks, security and privacy concerns of performing an evaluation and management service by telemedicine. I also discussed with the patient that there may be a patient responsible charge related to this service. The patient expressed understanding and agreed to proceed.  Pt location: Home Physician Location: office Name of referring provider:  Grayce Sessions, NP I connected with Tricia Ramsey at patients initiation/request on 12/06/2019 at  8:15 AM EDT by video enabled telemedicine application and verified that I am speaking with the correct person using two identifiers. Pt MRN:  [id number] Pt DOB:  1955/12/23 Video Participants:  Tricia Ramsey;    Assessment/Plan:   1.  Parkinson's disease  -Continue pramipexole 0.5 mg 3 times per day  -Discontinue propranolol  2.  Diabetes mellitus  -Not ideally controlled  Subjective   Pt f/u for Parkinsons Disease.  Pt denies falls.  Pt denies lightheadedness, near syncope.  No hallucinations.  Mood has been good.  BP been normal per patient.  Tremor comes and goes  Current movement d/o meds:  Pramipexole, 0.5 mg 3 times per day Propranolol, 20 mg daily (decreased from 20 mg 3 times per day last visit -patient was on it for tremor and prescribed before diagnosis of Parkinson's)   Current Outpatient Medications on File Prior to Visit  Medication Sig Dispense Refill  . buPROPion (WELLBUTRIN SR) 150 MG 12 hr tablet Take 1 tablet (150 mg total) by mouth 2 (two) times daily. 180 tablet 1  . glimepiride (AMARYL) 4 MG tablet Take 1 tablet (4 mg total) by mouth daily before breakfast. Needs office visit for more refills. 90 tablet 1  . Insulin Glargine  (BASAGLAR KWIKPEN) 100 UNIT/ML INJECT 30 UNITS INTO THE SKIN 2 (TWO) TIMES DAILY. 18 mL 5  . Insulin Pen Needle (PEN NEEDLES) 31G X 5 MM MISC Inject 1 each into the skin at bedtime. 100 each 3  . lisinopril (ZESTRIL) 2.5 MG tablet Take 1 tablet (2.5 mg total) by mouth daily. 90 tablet 1  . lovastatin (MEVACOR) 40 MG tablet Take 0.5 tablets (20 mg total) by mouth at bedtime. Needs labs for further refills 90 tablet 1  . metFORMIN (GLUCOPHAGE-XR) 500 MG 24 hr tablet Take 2 tablets (1,000 mg total) by mouth 2 (two) times daily. 120 tablet 2  . PARoxetine (PAXIL) 40 MG tablet TAKE 1 TABLET (40 MG TOTAL) BY MOUTH EVERY MORNING. 90 tablet 0  . sitaGLIPtin (JANUVIA) 100 MG tablet Take 1 tablet (100 mg total) by mouth daily. 90 tablet 1   No current facility-administered medications on file prior to visit.     Objective   Vitals:   12/06/19 0751  Weight: 209 lb (94.8 kg)  Height: 5\' 1"  (1.549 m)   GEN:  The patient appears stated age and is in NAD.  Neurological examination:  Orientation: The patient is alert and oriented x3. Cranial nerves: There is good facial symmetry. There is min facial hypomimia.  The speech is fluent and clear. Soft palate rises symmetrically and there is no tongue deviation. Hearing is intact to conversational tone. Motor: Strength is at least antigravity x 4.   Shoulder shrug is equal and symmetric.  There is no  pronator drift.  Movement examination: Tone: unable Abnormal movements: none seen but hands not seen at rest Coordination:  There is no decremation with RAM's in the UE Gait and Station: The patient has no difficulty arising out of a deep-seated chair without the use of the hands. The patient's stride length is good with good arm swing.    I have reviewed and interpreted the following labs independently Lab Results  Component Value Date   HGBA1C 8.6 (A) 10/03/2019     Chemistry      Component Value Date/Time   NA 140 10/03/2019 0920   K 4.2  10/03/2019 0920   CL 104 10/03/2019 0920   CO2 21 10/03/2019 0920   BUN 14 10/03/2019 0920   CREATININE 1.28 (H) 10/03/2019 0920      Component Value Date/Time   CALCIUM 9.5 10/03/2019 0920   ALKPHOS 80 10/03/2019 0920   AST 13 10/03/2019 0920   ALT 16 10/03/2019 0920   BILITOT 0.5 10/03/2019 0920     Lab Results  Component Value Date   TSH 0.616 01/09/2018   Lab Results  Component Value Date   WBC 10.7 10/03/2019   HGB 14.2 10/03/2019   HCT 41.1 10/03/2019   MCV 90 10/03/2019   PLT 251 10/03/2019     Follow up Instructions      -I discussed the assessment and treatment plan with the patient. The patient was provided an opportunity to ask questions and all were answered. The patient agreed with the plan and demonstrated an understanding of the instructions.   The patient was advised to call back or seek an in-person evaluation if the symptoms worsen or if the condition fails to improve as anticipated.      Kerin Salen, DO

## 2019-12-06 ENCOUNTER — Telehealth (INDEPENDENT_AMBULATORY_CARE_PROVIDER_SITE_OTHER): Payer: Self-pay | Admitting: Neurology

## 2019-12-06 ENCOUNTER — Other Ambulatory Visit: Payer: Self-pay

## 2019-12-06 ENCOUNTER — Encounter: Payer: Self-pay | Admitting: Neurology

## 2019-12-06 VITALS — Ht 61.0 in | Wt 209.0 lb

## 2019-12-06 DIAGNOSIS — G20 Parkinson's disease: Secondary | ICD-10-CM

## 2019-12-06 MED ORDER — PRAMIPEXOLE DIHYDROCHLORIDE 0.5 MG PO TABS: 0.5000 mg | ORAL_TABLET | Freq: Three times a day (TID) | ORAL | 1 refills | Status: DC

## 2020-01-02 NOTE — Progress Notes (Signed)
Virtual Visit Via Video   The purpose of this virtual visit is to provide medical care while limiting exposure to the novel coronavirus.    Consent was obtained for video visit:  Yes.   Answered questions that patient had about telehealth interaction:  Yes.   I discussed the limitations, risks, security and privacy concerns of performing an evaluation and management service by telemedicine. I also discussed with the patient that there may be a patient responsible charge related to this service. The patient expressed understanding and agreed to proceed.  Pt location: Home Physician Location: office Name of referring provider:  Grayce Sessions, NP I connected with Tricia Ramsey at patients initiation/request on 01/09/2020 at  8:15 AM EDT by video enabled telemedicine application and verified that I am speaking with the correct person using two identifiers. Pt MRN:  [id number] Pt DOB:  1956-02-16 Video Participants:  Tricia Ramsey;    Assessment/Plan:   1.  Parkinson's disease  -We can slightly increase her pramipexole to 0.75 mg 3 times per day.  Watch visual distortions.  Move last dose from bedtime to 6pm  -Discussed to distance to travel.  She really does have difficulty getting to our office, and often does video visits.  This is not an ideal way to treat Parkinson's disease, especially given that we cannot feel tone.  She has appt in march and that will be in person.  2.  Uncontrolled diabetes mellitus  -A1c is improving (8.6 in July compared to 9.06 months ago).  Subjective   Patient seen today in follow-up for Parkinson's disease.  Patient worked in earlier than expected as she emailed me and stated that she was having more tremor, particularly in the head and a feeling of inner tremor.  She notes this esp in the evening.  She was not able to drive the distance to our office and requested a video visit.  No compulsive behaviors.  No sleep attacks.  Pt denies falls.  Pt has some  lightheadness (usually with having a BM) but no near syncope.  No hallucinations but some visual distortions.  Mood has been good.  We did take her off of the propranolol that was started prior to her diagnosis of Parkinson's disease.  She was only on 20 mg daily.  Current movement d/o meds:  Pramipexole 0.5 mg 3 times per day (currently taking at 7am/noon/9pm per patient)   Current Outpatient Medications on File Prior to Visit  Medication Sig Dispense Refill  . buPROPion (WELLBUTRIN SR) 150 MG 12 hr tablet Take 1 tablet (150 mg total) by mouth 2 (two) times daily. 180 tablet 1  . glimepiride (AMARYL) 4 MG tablet Take 1 tablet (4 mg total) by mouth daily before breakfast. Needs office visit for more refills. 90 tablet 1  . Insulin Glargine (BASAGLAR KWIKPEN) 100 UNIT/ML INJECT 30 UNITS INTO THE SKIN 2 (TWO) TIMES DAILY. 18 mL 5  . Insulin Pen Needle (PEN NEEDLES) 31G X 5 MM MISC Inject 1 each into the skin at bedtime. 100 each 3  . lisinopril (ZESTRIL) 2.5 MG tablet Take 1 tablet (2.5 mg total) by mouth daily. 90 tablet 1  . lovastatin (MEVACOR) 40 MG tablet Take 0.5 tablets (20 mg total) by mouth at bedtime. Needs labs for further refills 90 tablet 1  . PARoxetine (PAXIL) 40 MG tablet TAKE 1 TABLET (40 MG TOTAL) BY MOUTH EVERY MORNING. 90 tablet 0  . pramipexole (MIRAPEX) 0.5 MG tablet Take 1 tablet (0.5  mg total) by mouth 3 (three) times daily. 270 tablet 1  . sitaGLIPtin (JANUVIA) 100 MG tablet Take 1 tablet (100 mg total) by mouth daily. 90 tablet 1   No current facility-administered medications on file prior to visit.     Objective   Vitals:   01/09/20 0743  Weight: 209 lb (94.8 kg)  Height: 5\' 1"  (1.549 m)   GEN:  The patient appears stated age and is in NAD.  Neurological examination:  Orientation: The patient is alert and oriented x3. Cranial nerves: There is good facial symmetry. There is no significant facial hypomimia.  The speech is fluent and clear. Soft palate rises  symmetrically and there is no tongue deviation. Hearing is intact to conversational tone. Motor: Strength is at least antigravity x 4.   Shoulder shrug is equal and symmetric.  There is no pronator drift.  Movement examination: Tone: unable Abnormal movements: ? Mild dyskinesia R hand (when she is ambulating) Coordination:  There is no decremation with RAM's Gait and Station: Patient pushes off of the chair to arise.  She ambulates well  I have reviewed and interpreted the following labs independently   Chemistry      Component Value Date/Time   NA 140 10/03/2019 0920   K 4.2 10/03/2019 0920   CL 104 10/03/2019 0920   CO2 21 10/03/2019 0920   BUN 14 10/03/2019 0920   CREATININE 1.28 (H) 10/03/2019 0920      Component Value Date/Time   CALCIUM 9.5 10/03/2019 0920   ALKPHOS 80 10/03/2019 0920   AST 13 10/03/2019 0920   ALT 16 10/03/2019 0920   BILITOT 0.5 10/03/2019 0920     Lab Results  Component Value Date   HGBA1C 8.6 (A) 10/03/2019     Follow up Instructions      -I discussed the assessment and treatment plan with the patient. The patient was provided an opportunity to ask questions and all were answered. The patient agreed with the plan and demonstrated an understanding of the instructions.   The patient was advised to call back or seek an in-person evaluation if the symptoms worsen or if the condition fails to improve as anticipated.     10/05/2019, DO

## 2020-01-03 ENCOUNTER — Ambulatory Visit (INDEPENDENT_AMBULATORY_CARE_PROVIDER_SITE_OTHER): Payer: No Typology Code available for payment source | Admitting: Primary Care

## 2020-01-09 ENCOUNTER — Other Ambulatory Visit: Payer: Self-pay

## 2020-01-09 ENCOUNTER — Encounter: Payer: Self-pay | Admitting: Neurology

## 2020-01-09 ENCOUNTER — Telehealth (INDEPENDENT_AMBULATORY_CARE_PROVIDER_SITE_OTHER): Payer: Self-pay | Admitting: Neurology

## 2020-01-09 VITALS — Ht 61.0 in | Wt 209.0 lb

## 2020-01-09 DIAGNOSIS — G20 Parkinson's disease: Secondary | ICD-10-CM

## 2020-01-09 MED ORDER — PRAMIPEXOLE DIHYDROCHLORIDE 0.75 MG PO TABS: 0.7500 mg | ORAL_TABLET | Freq: Three times a day (TID) | ORAL | 1 refills | Status: DC

## 2020-01-09 MED FILL — PRAMIPEXOLE 0.75 MG TABLET: 0.75 | 30 days supply | Qty: 90 | Fill #0

## 2020-01-23 ENCOUNTER — Other Ambulatory Visit: Payer: Self-pay | Admitting: Orthopedic Surgery

## 2020-01-23 ENCOUNTER — Other Ambulatory Visit (INDEPENDENT_AMBULATORY_CARE_PROVIDER_SITE_OTHER): Payer: Self-pay | Admitting: Primary Care

## 2020-01-23 DIAGNOSIS — E119 Type 2 diabetes mellitus without complications: Secondary | ICD-10-CM

## 2020-01-23 MED FILL — $LANTUS SOLOSTAR 100 UNITS/: 100 | 75 days supply | Qty: 45 | Fill #0

## 2020-01-23 MED FILL — GABAPENTIN 300 MG CAPSULE: 300 | 30 days supply | Qty: 30 | Fill #0

## 2020-02-05 MED FILL — PRAMIPEXOLE 0.75 MG TABLET: 0.75 | 30 days supply | Qty: 90 | Fill #1

## 2020-02-11 MED FILL — JANUVIA 100 MG TABLET: 100 | 30 days supply | Qty: 30 | Fill #4

## 2020-03-04 ENCOUNTER — Other Ambulatory Visit: Payer: Self-pay

## 2020-03-04 MED FILL — PARoxetine HCL 40 MG TABS: 40 | 30 days supply | Qty: 30 | Fill #2

## 2020-03-04 MED FILL — GLIMEPIRIDE 4 MG TABS: 4 | 30 days supply | Qty: 30 | Fill #5

## 2020-03-04 MED FILL — LISINOPRIL 2.5 MG TABLET: 2.5 | 30 days supply | Qty: 30 | Fill #5

## 2020-03-10 MED FILL — PRAMIPEXOLE 0.75 MG TABLET: 0.75 | 30 days supply | Qty: 90 | Fill #2

## 2020-03-10 MED FILL — GABAPENTIN 300 MG CAPSULE: 300 | 30 days supply | Qty: 30 | Fill #1

## 2020-04-02 MED FILL — PARoxetine HCL 40 MG TABS: 40 | 30 days supply | Qty: 30 | Fill #0

## 2020-04-02 MED FILL — $LANTUS SOLOSTAR 100 UNITS/: 100 | 30 days supply | Qty: 15 | Fill #0

## 2020-04-02 MED FILL — GLIMEPIRIDE 4 MG TABS: 4 | 30 days supply | Qty: 30 | Fill #0

## 2020-04-02 MED FILL — BUPROPION SR 150 MG TABLET: 150 | 30 days supply | Qty: 60 | Fill #1

## 2020-04-07 MED FILL — LISINOPRIL 5 MG TABLET: 5 | 30 days supply | Qty: 30 | Fill #0

## 2020-04-07 MED FILL — PRAMIPEXOLE 0.75 MG TABLET: 0.75 | 30 days supply | Qty: 90 | Fill #3

## 2020-04-11 ENCOUNTER — Telehealth: Payer: Self-pay | Admitting: Neurology

## 2020-04-11 DIAGNOSIS — G20 Parkinson's disease: Secondary | ICD-10-CM

## 2020-04-11 MED FILL — TRUEPLUS PEN NDL 31G X 1/4: 31G X 6 MM | 25 days supply | Qty: 100 | Fill #0

## 2020-04-11 NOTE — Telephone Encounter (Signed)
Spoke with patient and asked her to give me a phone and fax number. She states she thinks the number she gave me is right. I advised her to contact the office and get a correct phone and fax number. So we can get her records to the correct office. She voiced understanding and stated she will contact the office right now.    Patient called back and stated the Doctors name is Dr Rise Paganini.   Ph: [phone] Fax: [phone]  Order placed and faxed.

## 2020-04-11 NOTE — Telephone Encounter (Signed)
We will be happy to send records to new neurologist but I don't know anyone in her area

## 2020-04-11 NOTE — Telephone Encounter (Signed)
Patient called and requested a referral to a neurologist closer to her home in [place], [place], if Dr. Arbutus Leas knows of anyone there.   Patient advised to call her insurance company in the meantime to help locate area neurologists.

## 2020-04-11 NOTE — Telephone Encounter (Signed)
Patient called back in stating she found a neurologist. She stated to send her records to fax # [phone]. [facility].

## 2020-04-17 ENCOUNTER — Other Ambulatory Visit: Payer: Self-pay

## 2020-04-21 MED FILL — NICOTINE 21 MG/24HR PATCH: 21 | 28 days supply | Qty: 28 | Fill #0

## 2020-04-29 ENCOUNTER — Telehealth: Payer: Self-pay | Admitting: Neurology

## 2020-04-29 DIAGNOSIS — G20 Parkinson's disease: Secondary | ICD-10-CM

## 2020-04-29 NOTE — Telephone Encounter (Signed)
Referral to Marjie Skiff at [facility]. The patient's birthrate was cut off. They cannot schedule until this is updated. Dr.Charles Port is who the patient should see. Dr. Jory Sims is no longer with them. Updated Referral is needed.

## 2020-04-30 NOTE — Telephone Encounter (Signed)
Advised patient that Dr Tat is agreeable to refer her to another neurologist only and she should request that her pcp refer her to another Internist provider. She states she is currently seeing a provider at [facility] health dept. I advised her to have them place the referral and when she finds another neurologist for her to let us know and we will send the referral. She voiced understanding then requested to have her upcoming follow up appointment canceled.

## 2020-04-30 NOTE — Telephone Encounter (Signed)
Our intention was to refer her to a neurologist at her request, not an internist.  Is there a neurologist in the practice?  If not, patient will need to find [place] a neurologist to refer her to in order to transfer her neuro care.

## 2020-04-30 NOTE — Telephone Encounter (Signed)
Received call from medical records stating they received a call from [facility] stating the birthday was cut off of the referral. She gave me the number.   Contacted [facility] and left a message for medical records informing her that we will not be resending the referral because Dr Tat was under the impression the referral was going to another neurologist. Advised that I left a message the patient has been notified and will have her pcp resend the referral. Advised a call back with any questions or concerns.

## 2020-05-01 NOTE — Addendum Note (Signed)
Addended by: Kandice Robinsons T on: 05/01/2020 01:44 PM   Modules accepted: Orders

## 2020-05-01 NOTE — Telephone Encounter (Signed)
Spoke with Dewayne Hatch in the medical records department at [facility] and was informed that they have multiple specialty's in their office such as neurology, pulmonology and endocrinology.   She states if Dr Tat is agreeable with the referral then we can redo the order and send the referral to Dr Boykin Reaper. Advised her that the patient stated she wanted a referral to pcp and advised that we were only sending a neurology referral.    Spoke with Dr Tat who states we can refer the patient to Dr Boykin Reaper for neurology.   New referral placed and faxed.

## 2020-06-05 ENCOUNTER — Ambulatory Visit: Payer: No Typology Code available for payment source | Admitting: Neurology

## 2020-06-06 ENCOUNTER — Telehealth: Payer: Self-pay | Admitting: Neurology

## 2020-06-06 NOTE — Telephone Encounter (Signed)
Patients pharmacy is requesting a refill on pramipexole. Is this ok?

## 2020-06-06 NOTE — Telephone Encounter (Signed)
The following message was left with [employer]: "Caller states she is calling from the pharmacy. She needs a refill for patient."

## 2020-06-06 NOTE — Telephone Encounter (Signed)
Let pt know that we will give her 90 day supply and that will be it since she has decided to transfer care locally.  Wish her good luck!

## 2020-06-09 ENCOUNTER — Other Ambulatory Visit: Payer: Self-pay | Admitting: Neurology

## 2020-06-09 MED ORDER — PRAMIPEXOLE DIHYDROCHLORIDE 0.75 MG PO TABS
0.7500 mg | ORAL_TABLET | Freq: Three times a day (TID) | ORAL | 0 refills | Status: AC
Start: 2020-06-09 — End: ?

## 2020-06-09 NOTE — Telephone Encounter (Signed)
Rx(s) sent to pharmacy electronically.  Left message for patient to contact office.

## 2020-06-09 NOTE — Telephone Encounter (Signed)
Spoke with patient and made her aware of Dr Don Perking recommendations.   The patient states she called [facility] and they stated they had not received the rx. The patient requested we sent the rx to [facility]. I advised patient that I would give them a call and get back to her. She voiced understanding.   I called  the pharmacy stated they received the rx this morning. I requested this medication be sent to the patient today. They voiced understanding.   Contacted the patient and made her aware. She voiced understanding.

## 2020-06-12 ENCOUNTER — Other Ambulatory Visit: Payer: Self-pay

## 2020-06-18 ENCOUNTER — Other Ambulatory Visit: Payer: Self-pay

## 2020-06-23 ENCOUNTER — Other Ambulatory Visit (HOSPITAL_COMMUNITY): Payer: Self-pay

## 2020-07-10 ENCOUNTER — Other Ambulatory Visit: Payer: Self-pay

## 2020-07-10 MED ORDER — INSULIN GLARGINE 100 UNIT/ML ~~LOC~~ SOLN: SUBCUTANEOUS | 2 refills | Status: AC

## 2020-07-11 ENCOUNTER — Other Ambulatory Visit: Payer: Self-pay | Admitting: Neurology

## 2020-08-13 ENCOUNTER — Other Ambulatory Visit: Payer: Self-pay | Admitting: Neurology

## 2020-09-04 ENCOUNTER — Other Ambulatory Visit: Payer: Self-pay | Admitting: Neurology

## 2020-09-18 ENCOUNTER — Telehealth: Payer: Self-pay | Admitting: Neurology

## 2020-09-18 NOTE — Telephone Encounter (Signed)
Pt called in stating she needs a refill of her pramipexole until she see's her new Neurologist on 09/24/20. She says she has been out of her pills for a month now. It would need to go to [facility] in [place], [place].

## 2020-09-18 NOTE — Telephone Encounter (Signed)
Pt called back no answer no voice mail. She is no longer a pt we are not able to refill her medication last time she was seen is 2021

## 2020-09-19 NOTE — Telephone Encounter (Signed)
Called patient and left a message for a call back.  

## 2020-09-19 NOTE — Telephone Encounter (Signed)
Patient returned a call to [facility].

## 2020-09-19 NOTE — Telephone Encounter (Signed)
Called patient and informed her that because she is no longer a pt here and has not been seen since 2021 we cannot fill her requested medication. Patient verbalized understanding and had no further questions or concerns.

## 2021-09-11 ENCOUNTER — Other Ambulatory Visit (HOSPITAL_COMMUNITY): Payer: Self-pay

## 2021-10-24 IMAGING — MG DIGITAL SCREENING BILAT W/ TOMO W/ CAD
8 series · 8 of 24 positions shown · non-contrast
Comparison: Previous exam(s).

CLINICAL DATA: Screening.

EXAM:
DIGITAL SCREENING BILATERAL MAMMOGRAM WITH TOMO AND CAD

[R CC synth-2D]
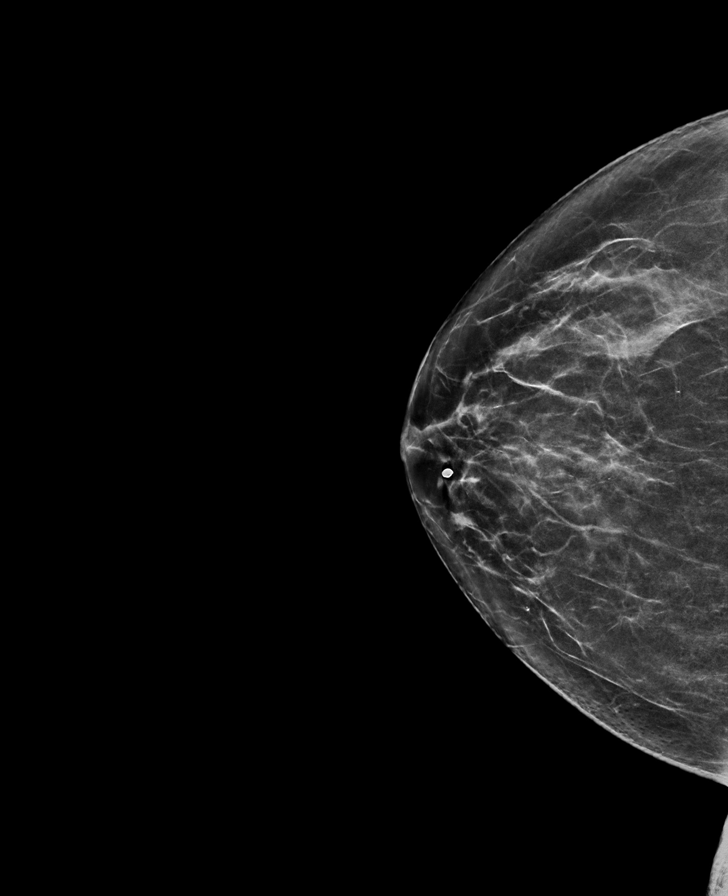

[L MLO synth-2D]
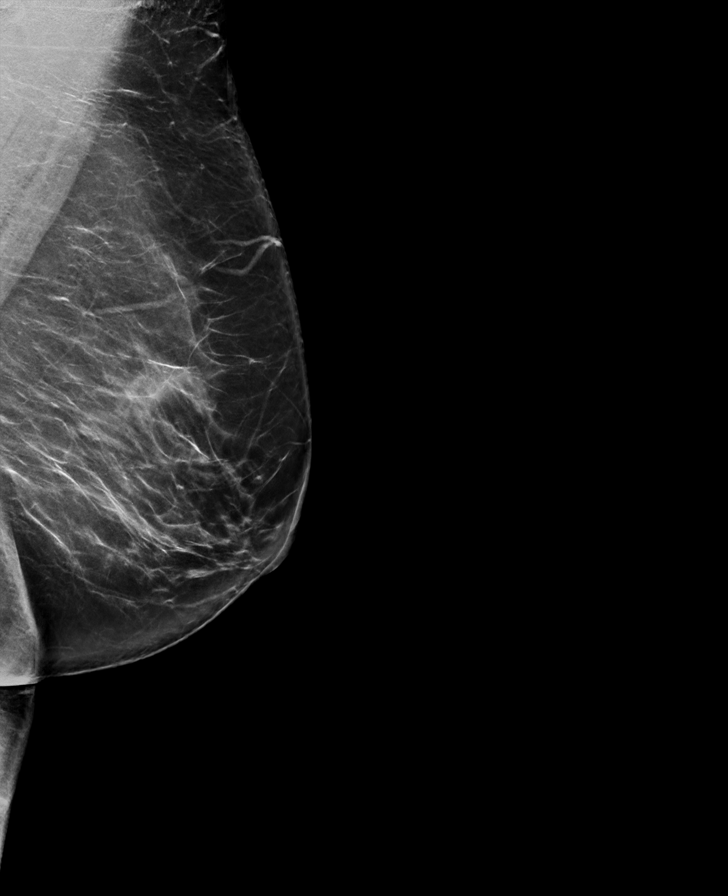

[R MLO synth-2D]
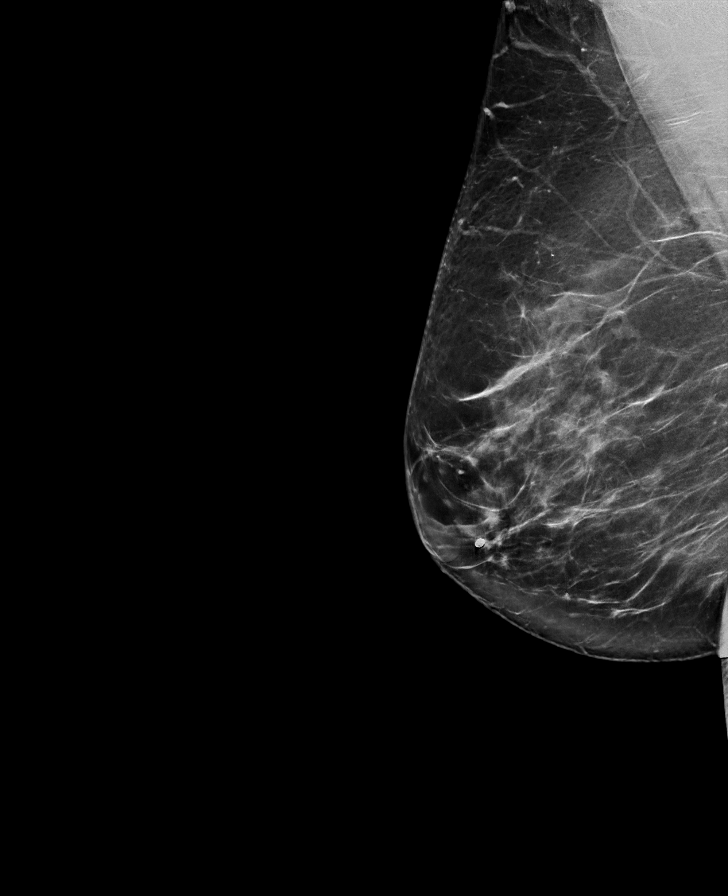

[L CC synth-2D]
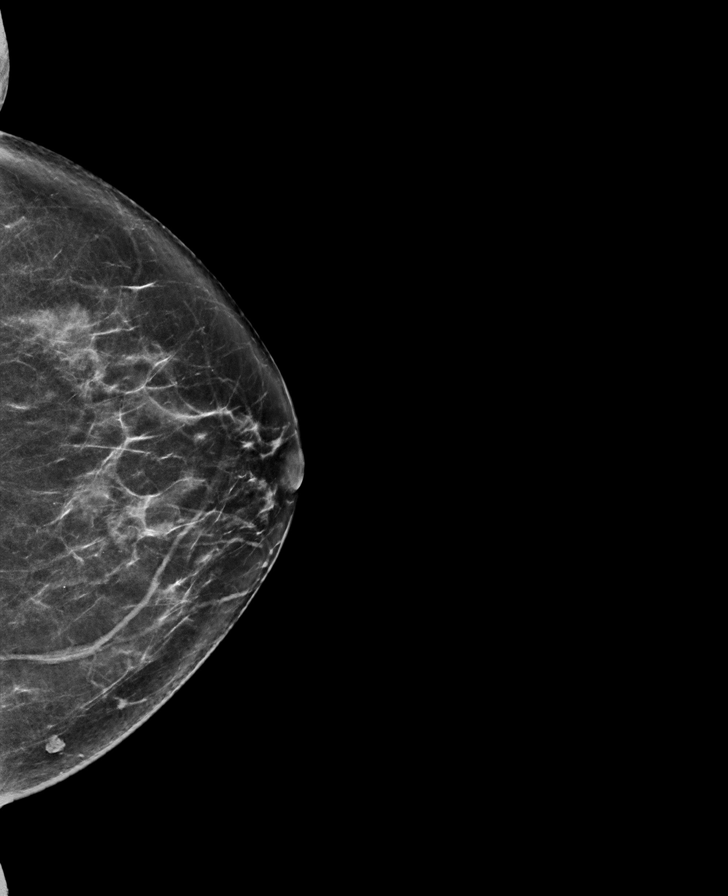

[R CC tomo · tomo slice 35/68.0]
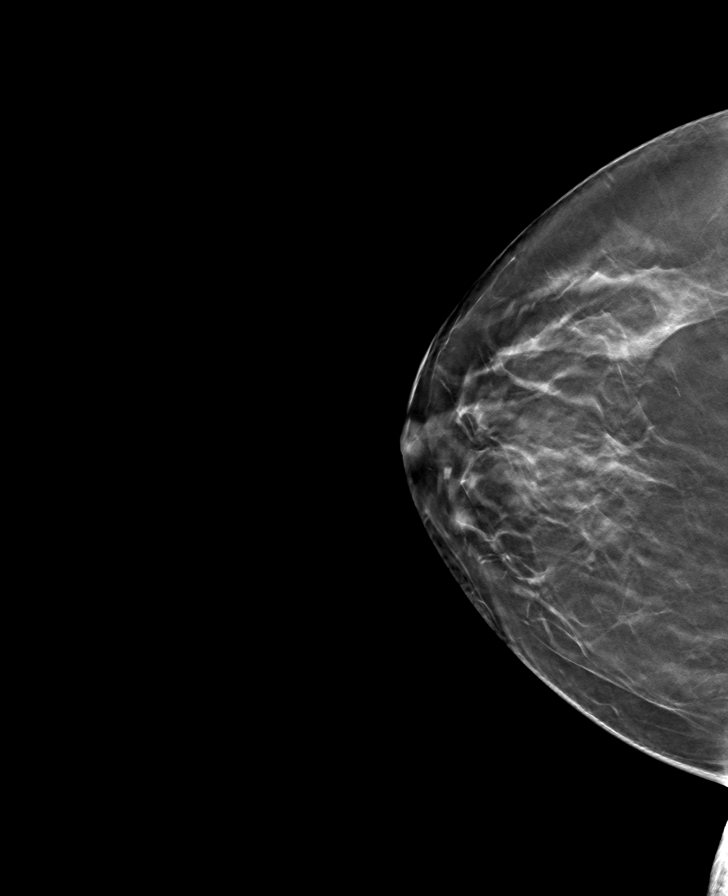

[R MLO tomo · tomo slice 41/82.0]
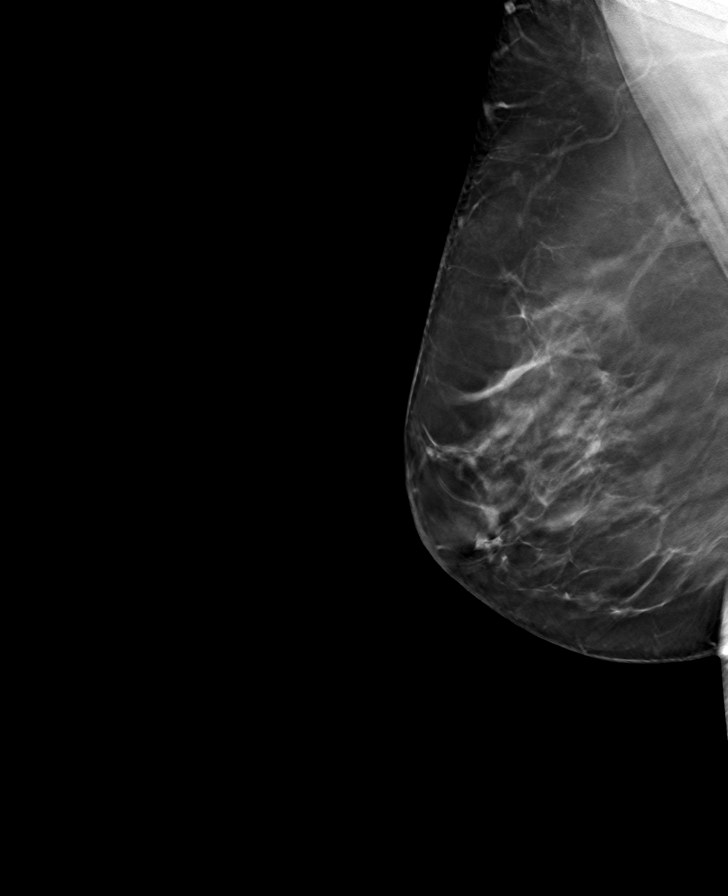

[L CC tomo · tomo slice 37/74.0]
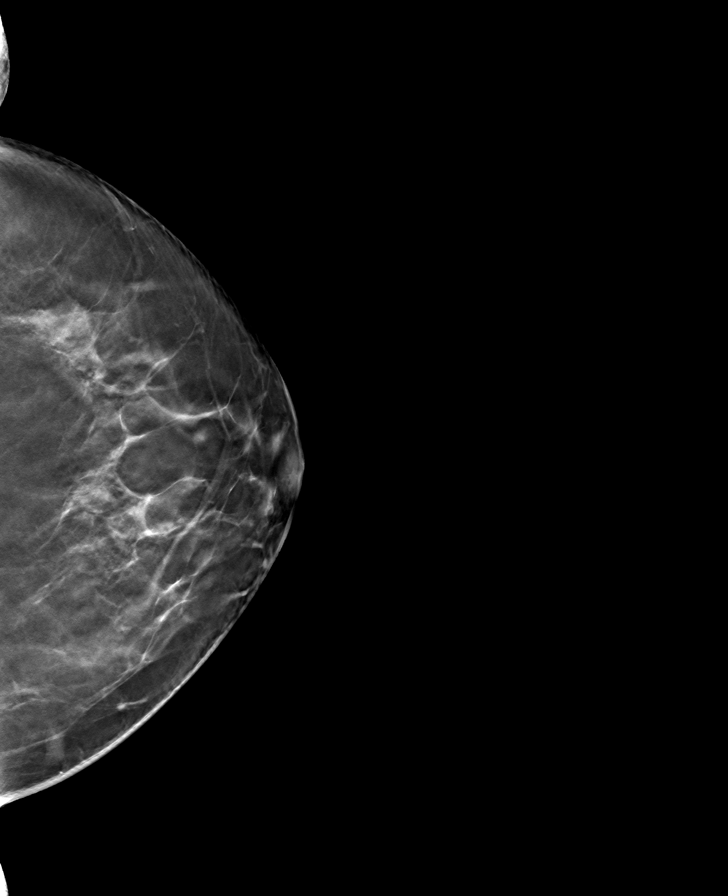

[L MLO tomo · tomo slice 43/84.0]
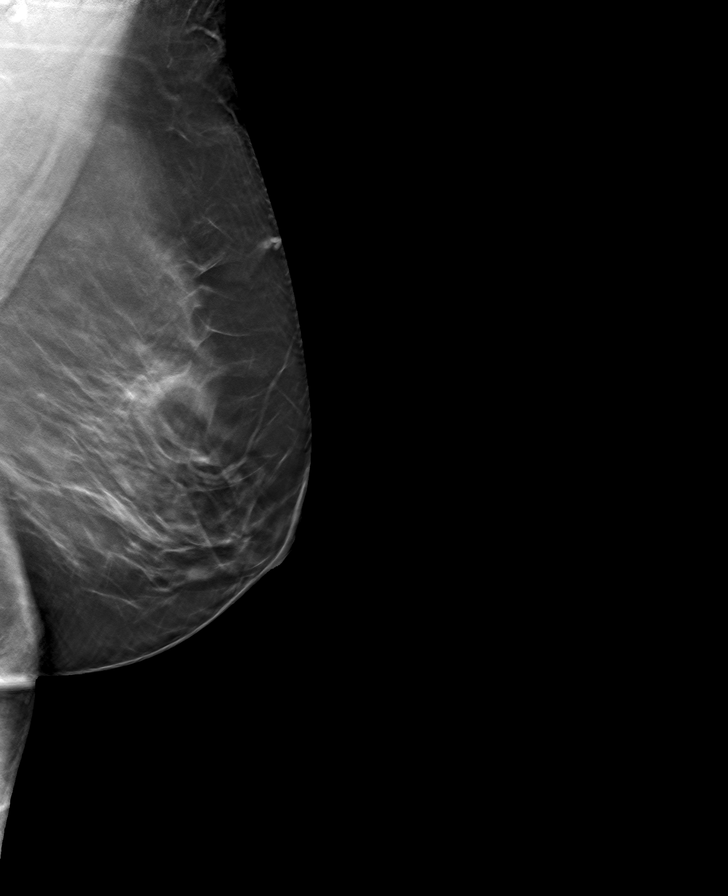

[8 of 24 positions shown; findings below may reference images not displayed]

ACR Breast Density Category c: The breast tissue is heterogeneously
dense, which may obscure small masses.
FINDINGS: There are no findings suspicious for malignancy. Images were
processed with CAD.
IMPRESSION: No mammographic evidence of malignancy. A result letter of this
screening mammogram will be mailed directly to the patient.

RECOMMENDATION:
Screening mammogram in one year. (Code:[id number]-U-LHB)

BI-RADS CATEGORY  1: Negative.
